# Patient Record
Sex: Female | Born: 1975
Health system: Southern US, Community
[De-identification: ages and names within clinical notes are randomized; demographics above are authoritative.]

## PROBLEM LIST (undated history)

## (undated) DIAGNOSIS — F419 Anxiety disorder, unspecified: Secondary | ICD-10-CM

## (undated) HISTORY — PX: ADENOIDECTOMY: SUR15

## (undated) HISTORY — DX: Anxiety disorder, unspecified: F41.9

## (undated) HISTORY — PX: FRACTURE SURGERY: SHX138

## (undated) HISTORY — PX: OTHER SURGICAL HISTORY: SHX169

---

## 2005-07-07 DIAGNOSIS — R7611 Nonspecific reaction to tuberculin skin test without active tuberculosis: Secondary | ICD-10-CM | POA: Insufficient documentation

## 2005-07-07 LAB — HM PAP SMEAR: HM Pap smear: NORMAL

## 2006-01-29 ENCOUNTER — Ambulatory Visit (HOSPITAL_COMMUNITY): Admission: RE | Admit: 2006-01-29 | Discharge: 2006-01-29 | Payer: Self-pay | Admitting: Infectious Diseases

## 2006-02-08 ENCOUNTER — Ambulatory Visit (HOSPITAL_COMMUNITY): Admission: RE | Admit: 2006-02-08 | Discharge: 2006-02-08 | Payer: Self-pay | Admitting: Internal Medicine

## 2006-02-08 ENCOUNTER — Ambulatory Visit: Payer: Self-pay | Admitting: Internal Medicine

## 2008-02-13 ENCOUNTER — Ambulatory Visit (HOSPITAL_COMMUNITY): Admission: RE | Admit: 2008-02-13 | Discharge: 2008-02-13 | Payer: Self-pay | Admitting: Specialist

## 2008-11-16 ENCOUNTER — Encounter: Payer: Self-pay | Admitting: Internal Medicine

## 2008-11-19 ENCOUNTER — Encounter: Payer: Self-pay | Admitting: Internal Medicine

## 2008-11-19 ENCOUNTER — Ambulatory Visit: Payer: Self-pay | Admitting: Internal Medicine

## 2008-11-19 LAB — CONVERTED CEMR LAB
ALT: 30 units/L (ref 0–35)
AST: 26 units/L (ref 0–37)
Albumin: 4.9 g/dL (ref 3.5–5.2)
Alkaline Phosphatase: 54 units/L (ref 39–117)
BUN: 14 mg/dL (ref 6–23)
CO2: 24 meq/L (ref 19–32)
Calcium: 9.8 mg/dL (ref 8.4–10.5)
Chloride: 105 meq/L (ref 96–112)
Cholesterol: 159 mg/dL (ref 0–200)
Cortisol - AM: 5.4 ug/dL (ref 4.3–22.4)
Creatinine, Ser: 0.61 mg/dL (ref 0.40–1.20)
Ferritin: 10 ng/mL (ref 10–291)
GFR calc Af Amer: >60 mL/min (ref >60)
GFR calc non Af Amer: >60 mL/min (ref >60)
Glucose, Bld: 91 mg/dL (ref 70–99)
HCT: 39.8 % (ref 36.0–46.0)
HDL: 56 mg/dL (ref >39)
Hemoglobin: 13.3 g/dL (ref 12.0–15.0)
LDL Cholesterol: 92 mg/dL (ref 0–99)
MCHC: 33.4 g/dL (ref 30.0–36.0)
MCV: 87.9 fL (ref 78.0–100.0)
Platelets: 242 10*3/uL (ref 150–400)
Potassium: 4.3 meq/L (ref 3.5–5.3)
RBC: 4.53 M/uL (ref 3.87–5.11)
RDW: 14 % (ref 11.5–15.5)
Sodium: 140 meq/L (ref 135–145)
TSH: 1.674 microintl units/mL (ref 0.350–4.500)
Total Bilirubin: 0.5 mg/dL (ref 0.3–1.2)
Total CHOL/HDL Ratio: 2.8
Total Protein: 7.3 g/dL (ref 6.0–8.3)
Triglycerides: 53 mg/dL (ref <150)
VLDL: 11 mg/dL (ref 0–40)
Vit D, 25-Hydroxy: 34 ng/mL (ref 30–89)
Vitamin B-12: 603 pg/mL (ref 211–911)
WBC: 5.4 10*3/uL (ref 4.0–10.5)

## 2008-11-20 ENCOUNTER — Ambulatory Visit: Payer: Self-pay | Admitting: Internal Medicine

## 2010-07-06 HISTORY — PX: WISDOM TOOTH EXTRACTION: SHX21

## 2010-08-05 NOTE — Assessment & Plan Note (Signed)
Summary: acute-overbook/cfb   Vital Signs:  Patient profile:   35 year old female Height:      67 inches (170.18 cm) Weight:      125.06 pounds (56.85 kg) BMI:     19.66 Temp:     97.9 degrees F (36.61 degrees C) oral Pulse rate:   80 / minute BP sitting:   112 / 69  (left arm)  Vitals Entered By: Angelina Ok RN (Nov 20, 2008 1:49 PM) Is Patient Diabetic? No Pain Assessment Patient in pain? no      Nutritional Status BMI of 19 -24 = normal  Have you ever been in a relationship where you felt threatened, hurt or afraid?No   Does patient need assistance? Functional Status Self care Ambulation Normal   History of Present Illness: No complaints. No active issues.  Preventive Screening-Counseling & Management     Alcohol drinks/day: 0     Smoking Status: never     Does Patient Exercise: yes  Allergies: No Known Drug Allergies   Impression & Recommendations:  Problem # 1:  LABORATORY EXAM ORDER PART ROUTINE GEN MED EXAM (ICD-V72.62)  Mild anemia. Otherwise normal labs. No further work-up.   PLan f/u in 1 year. Consider by mouth iron and MVI.  Orders: No Charge Patient Arrived (NCPA0) (NCPA0)

## 2010-08-05 NOTE — Miscellaneous (Signed)
  Clinical Lists Changes  Problems: Added new problem of LABORATORY EXAM ORDER PART ROUTINE GEN MED EXAM (ICD-V72.62) - Signed Orders: Added new Test order of T-Comprehensive Metabolic Panel [id number]) - Signed Added new Test order of T-CBC No Diff ([id number]) - Signed Added new Test order of T-Lipid Profile [phone]) - Signed Added new Test order of T-TSH [phone]) - Signed Added new Test order of T-Vitamin D (25-Hydroxy) [phone]) - Signed Added new Test order of T-Ferritin [phone]) - Signed Added new Test order of T-Vitamin B12 ([id number]) - Signed Added new Test order of T-Cortisol, AM ([id number]) - Signed

## 2013-09-29 LAB — BASIC METABOLIC PANEL
BUN: 19 mg/dL (ref 4–21)
Creatinine: 0.6 mg/dL (ref <=1.1)
Glucose: 75 mg/dL
Potassium: 4.1 mmol/L (ref 3.4–5.3)
Sodium: 140 mmol/L (ref 137–147)

## 2013-09-29 LAB — TSH: TSH: 1.26 u[IU]/mL (ref <=5.90)

## 2013-09-29 LAB — HEPATIC FUNCTION PANEL
ALT: 10 U/L (ref 7–35)
AST: 18 U/L (ref 13–35)
Alkaline Phosphatase: 45 U/L (ref 25–125)

## 2013-09-29 LAB — CBC AND DIFFERENTIAL
HCT: 37 % (ref 36–46)
Hemoglobin: 12.6 g/dL (ref 12.0–16.0)
Platelets: 205 10*3/uL (ref 150–399)
WBC: 5.6 10^3/mL

## 2013-09-29 LAB — LIPID PANEL
Cholesterol: 114 mg/dL (ref 0–200)
HDL: 51 mg/dL (ref 35–70)
LDL Cholesterol: 53 mg/dL
Triglycerides: 50 mg/dL (ref 40–160)

## 2013-09-29 LAB — HEMOGLOBIN A1C: Hgb A1c MFr Bld: 5.4 % (ref 4.0–6.0)

## 2014-02-14 ENCOUNTER — Other Ambulatory Visit (INDEPENDENT_AMBULATORY_CARE_PROVIDER_SITE_OTHER): Payer: Self-pay

## 2014-02-14 ENCOUNTER — Other Ambulatory Visit: Payer: Self-pay | Admitting: Endocrinology

## 2014-02-14 DIAGNOSIS — R300 Dysuria: Secondary | ICD-10-CM

## 2014-02-14 LAB — URINALYSIS, ROUTINE W REFLEX MICROSCOPIC
Bilirubin Urine: NEGATIVE
Hgb urine dipstick: NEGATIVE
Ketones, ur: NEGATIVE
Leukocytes, UA: NEGATIVE
Nitrite: NEGATIVE
RBC / HPF: NONE SEEN (ref 0–?)
Specific Gravity, Urine: 1.02 (ref 1.000–1.030)
Total Protein, Urine: NEGATIVE
Urine Glucose: NEGATIVE
Urobilinogen, UA: 0.2 (ref 0.0–1.0)
pH: 8 (ref 5.0–8.0)

## 2014-09-12 ENCOUNTER — Encounter: Payer: Self-pay | Admitting: Family Medicine

## 2014-09-12 ENCOUNTER — Ambulatory Visit (INDEPENDENT_AMBULATORY_CARE_PROVIDER_SITE_OTHER): Payer: 59 | Admitting: Family Medicine

## 2014-09-12 DIAGNOSIS — R002 Palpitations: Secondary | ICD-10-CM | POA: Insufficient documentation

## 2014-09-12 DIAGNOSIS — Z0000 Encounter for general adult medical examination without abnormal findings: Secondary | ICD-10-CM | POA: Insufficient documentation

## 2014-09-12 DIAGNOSIS — M545 Low back pain, unspecified: Secondary | ICD-10-CM

## 2014-09-12 DIAGNOSIS — Z789 Other specified health status: Secondary | ICD-10-CM | POA: Insufficient documentation

## 2014-09-12 DIAGNOSIS — M542 Cervicalgia: Secondary | ICD-10-CM

## 2014-09-12 DIAGNOSIS — M533 Sacrococcygeal disorders, not elsewhere classified: Secondary | ICD-10-CM

## 2014-09-12 LAB — FERRITIN: Ferritin: 36

## 2014-09-12 LAB — VITAMIN D 25 HYDROXY (VIT D DEFICIENCY, FRACTURES): Vit D, 25-Hydroxy: 44

## 2014-09-12 LAB — T3, FREE: T3, Free Index: 2.7

## 2014-09-12 LAB — T4, FREE: Free T4: 1.19

## 2014-09-12 LAB — VITAMIN B12: Vitamin B12 Bind Capacity: 654

## 2014-09-12 MED ORDER — MAGNESIUM GLUCONATE 500 MG PO TABS: 500.0000 mg | ORAL_TABLET | Freq: Every day | ORAL | Status: AC

## 2014-09-12 MED ORDER — DICLOFENAC EPOLAMINE 1.3 % TD PTCH: 1.0000 | MEDICATED_PATCH | Freq: Two times a day (BID) | TRANSDERMAL | Status: DC

## 2014-09-12 NOTE — Assessment & Plan Note (Signed)
>  5 years. We will obtain films of coccyx given chronicity hopeful for PA and lateral. Also obtain lumbar films though suspect lower yield. She will continue conservative measures of watching her position. IN addition, will trial diclofenac patch. If not helpful, could consider lidocaine.   Could also consider sports medicine or otho referral. i doubt manipulation would happen as seems to have direct pain and not muscle spasms as cause of pain. Option for coccygeal injections would potentially ask Dr. Katrinka BlazingSmith of sports medicine who he would recommend.

## 2014-09-12 NOTE — Progress Notes (Signed)
Elizabeth ConchStephen Kaelene Elliston, MD Phone: [phone]  Subjective:  Patient presents today to establish care. Chief complaint-noted.   Coccydynia -history of back pain with pregnancy. Osteitis Pubis. Had hip pain and later symphysis pain. Nearly fainted in pregnancy related to those pains. Since that time, she developed pain with sitting. Cannot sit directly on coccyx. Severe pain with sitting straight up.  Pain up to 7/10. Sensitivity with rubbing the area. Avoids eating in restaurants depending on chair. Ok with sitting forward such as work position. Never had films. Worse with prolonged sitting such as plane flights or on certain hard chairs. Interested in topical ROS- no fecal or urinary incontinence, no leg weakness   Palpitations History of L upper chest pain and current neck pain  Has [place] chiropractor in [place] after hitting Left upper chest against an object. Since that time in 2012 developed palpitations. Has had upper back pain after initial chest injury behind Left upper scapula. Has had laser acupuncture which improved. Her palpitations have been present since that time. If she has a busier or more stressful period, her palpitations are certainly worse. When her husband is off on weekends and can help at home for example she does not note the palpitations. Most aware when she is resting. Has checked her pulse and can tell there is an occasional skipped and then subsequent strong beat.   Does have some cervical neck pain with sitting and leaning forward. Better with exercising. Has seen chiropractor and considering going back to the one she has seen in [place] in the past  ROS- no chest pain/shortness of breath/diaphoresis, left arm or neck pain (pain in neck is midline on vertebrae at base of neck likely around c7. )  The following were reviewed and entered/updated in epic: No past medical history on file. Patient Active Problem List   Diagnosis Date Noted  . Coccydynia 09/12/2014   Priority: Medium  . Vegetarian 09/12/2014    Priority: Low  . Health care maintenance 09/12/2014    Priority: Low  . 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 Palpitations 09/12/2014   Past Surgical History  Procedure Laterality Date  . Wisdom tooth extraction  2012  . Adenoidectomy      as child  . Other surgical history      fractured hunmerus 1983.    Family History  Problem Relation Age of Onset  . Arthritis Mother     knee  . Hyperlipidemia Father   . Hypertension Father   . CAD      50, likely-has refused cath    Medications- reviewed and updated Current Outpatient Prescriptions  Medication Sig Dispense Refill  . magnesium gluconate (MAGONATE) 500 MG tablet Take 500 mg by mouth 2 (two) times daily.    . Multiple Vitamin (MULTIVITAMIN) tablet Take 1 tablet by mouth daily.    . diclofenac (FLECTOR) 1.3 % PTCH Place 1 patch onto the skin 2 (two) times daily. 5 patch 5   No current facility-administered medications for this visit.    Allergies-reviewed and updated No Known Allergies  History   Social History  . Marital Status: Married    Spouse Name: N/A  . Number of Children: N/A  . Years of Education: N/A   Social History Main Topics  . Smoking status: Never Smoker   . Smokeless tobacco: Not on file  . Alcohol Use: 0.6 oz/week    1 Standard drinks or equivalent per week  . Drug Use: No  . Sexual  Activity: Not on file   Other Topics Concern  . Not on file   Social History Narrative   Married. Osborne Casco '06. Husband is a hospitalist (may establish at [place])   Lives with husband and daughter.       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     ROS--See HPI , otherwise full ROS was completed and negative except as noted above  Objective: BP 110/72 mmHg  Pulse 83  Temp(Src) 98.5 F (36.9 C)  Ht  (1.702 m)  Wt 120 lb (54.432  kg)  BMI 18.79 kg/m2 Gen: NAD, resting comfortably, thin HEENT: Mucous membranes are moist. Oropharynx normal. TM normal. Eyes: sclera and lids normal, PERRLA Neck:  no cervical lymphadenopathy CV: RRR no murmurs rubs or gallops Lungs: CTAB no crackles, wheeze, rhonchi Abdomen: soft/nontender/nondistended/normal bowel sounds. No rebound or guarding.  Ext: no edema MSK: pain with direct palpation of coccyx but unable to reproduce in other position. Pain with direct palpation around c7 vertebrae Skin: warm, dry, no rash Neuro: 5/5 strength in upper and lower extremities, normal gait, normal reflexes  EKG: rate 68 NSR, slight flattening v2  Assessment/Plan:  Coccydynia >5 years. We will obtain films of coccyx given chronicity hopeful for PA and lateral. Also obtain lumbar films though suspect lower yield. She will continue conservative measures of watching her position. IN addition, will trial diclofenac patch. If not helpful, could consider lidocaine.   Could also consider sports medicine or otho referral. i doubt manipulation would happen as seems to have direct pain and not muscle spasms as cause of pain. Option for coccygeal injections would potentially ask Dr. Katrinka Blazing of sports medicine who he would recommend.    Palpitations EKG today revealed NSR. We discussed holter monitor for at least 24 hours on busier week as well as potentially echocardiogram. Patient not interested in pursuing at this time. Also discussed CBT and not interested. 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 Husband is a hospitalist and they will discuss.    we will also obtain neck films with pain of palpation around c7. Mild associated spasm lateral to this.   1 year or prn   Orders Placed This Encounter  Procedures  . DG Sacrum/Coccyx    Standing Status: Future     Number of  Occurrences:      Standing Expiration Date: 11/12/2015    Order Specific Question:  Reason for Exam (SYMPTOM  OR DIAGNOSIS REQUIRED)    Answer:  2 view, coccygeal pain    Order Specific Question:  Is the patient pregnant?    Answer:  No    Order Specific Question:  Preferred imaging location?    Answer:  [facility]  . DG Lumbar Spine Complete    Standing Status: Future     Number of Occurrences:      Standing Expiration Date: 11/12/2015    Order Specific Question:  Reason for Exam (SYMPTOM  OR DIAGNOSIS REQUIRED)    Answer:  low back pain, coccygeal pain    Order Specific Question:  Is the patient pregnant?    Answer:  No    Order Specific Question:  Preferred imaging location?    Answer:  [facility]  . DG Cervical Spine Complete    Standing  Status: Future     Number of Occurrences:      Standing Expiration Date: 11/12/2015    Order Specific Question:  Reason for Exam (SYMPTOM  OR DIAGNOSIS REQUIRED)    Answer:  pain with palpation around c7, upper back pain    Order Specific Question:  Is the patient pregnant?    Answer:  No    Order Specific Question:  Preferred imaging location?    Answer:  [facility]  . EKG 12-Lead    Meds ordered this encounter  . diclofenac (FLECTOR) 1.3 % PTCH    Sig: Place 1 patch onto the skin 2 (two) times daily.    Dispense:  5 patch    Refill:  5

## 2014-09-12 NOTE — Patient Instructions (Signed)
Great to meet you in person!   Let's get images of lumbar spine as well as sacrum/coccyx (I requested they do 2 views) as well as cervical spine.   EKG largely unremarkable. I cannot say that this rules out cardiac disease but it is somewhat reassuring.   Let's see each other at least every 2 years or as needed. But happy to see you sooner.

## 2014-09-12 NOTE — Assessment & Plan Note (Signed)
EKG today revealed NSR. We discussed holter monitor for at least 24 hours on busier week as well as potentially echocardiogram. Patient not interested in pursuing at this time. Also discussed CBT and not interested. 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 Husband is a hospitalist and they will discuss.

## 2014-09-18 ENCOUNTER — Other Ambulatory Visit: Payer: Self-pay | Admitting: Family Medicine

## 2014-09-18 ENCOUNTER — Ambulatory Visit (INDEPENDENT_AMBULATORY_CARE_PROVIDER_SITE_OTHER)
Admission: RE | Admit: 2014-09-18 | Discharge: 2014-09-18 | Disposition: A | Discharge status: Home / Self Care | Payer: 59 | Source: Ambulatory Visit | Attending: Family Medicine | Admitting: Family Medicine

## 2014-09-18 DIAGNOSIS — M545 Low back pain, unspecified: Secondary | ICD-10-CM

## 2014-09-18 DIAGNOSIS — M542 Cervicalgia: Secondary | ICD-10-CM

## 2014-09-20 ENCOUNTER — Encounter: Payer: Self-pay | Admitting: Family Medicine

## 2014-09-26 ENCOUNTER — Encounter: Payer: Self-pay | Admitting: Occupational Medicine

## 2014-10-19 ENCOUNTER — Other Ambulatory Visit: Payer: 59

## 2014-10-19 DIAGNOSIS — Z0000 Encounter for general adult medical examination without abnormal findings: Secondary | ICD-10-CM

## 2014-10-19 LAB — CBC AND DIFFERENTIAL
HCT: 39 % (ref 36–46)
Hemoglobin: 12.8 g/dL (ref 12.0–16.0)
Platelets: 257 10*3/uL (ref 150–399)
WBC: 5.4 10^3/mL

## 2014-10-19 LAB — BASIC METABOLIC PANEL
BUN: 21 mg/dL (ref 4–21)
Creatinine: 0.7 mg/dL (ref <=1.1)
Glucose: 92 mg/dL
Potassium: 4.3 mmol/L (ref 3.4–5.3)
Sodium: 140 mmol/L (ref 137–147)

## 2014-10-19 LAB — LIPID PANEL
Cholesterol: 127 mg/dL (ref 0–200)
LDL Cholesterol: 60 mg/dL
Triglycerides: 47 mg/dL (ref 40–160)

## 2014-10-19 LAB — TSH: TSH: 2.04 u[IU]/mL (ref <=5.90)

## 2014-10-19 LAB — HEMOGLOBIN A1C: Hgb A1c MFr Bld: 5.3 % (ref 4.0–6.0)

## 2014-10-19 LAB — HEPATIC FUNCTION PANEL
ALT: 28 U/L (ref 7–35)
AST: 24 U/L (ref 13–35)
Bilirubin, Total: 0.6 mg/dL

## 2014-10-29 ENCOUNTER — Encounter: Payer: Self-pay | Admitting: Family Medicine

## 2014-10-29 LAB — FERRITIN
Ferritin: 24
Vit D, 25-Hydroxy: 47
Vitamin B12 Bind Capacity: 740

## 2014-11-09 ENCOUNTER — Encounter: Payer: Self-pay | Admitting: Family Medicine

## 2014-12-09 ENCOUNTER — Encounter: Payer: Self-pay | Admitting: Family Medicine

## 2014-12-10 ENCOUNTER — Other Ambulatory Visit: Payer: Self-pay

## 2014-12-10 MED ORDER — LIDOCAINE 5 % EX PTCH: 1.0000 | MEDICATED_PATCH | CUTANEOUS | Status: DC

## 2014-12-10 NOTE — Telephone Encounter (Signed)
rx sent in 

## 2015-10-18 LAB — HEPATIC FUNCTION PANEL
ALT: 12 U/L (ref 7–35)
AST: 15 U/L (ref 13–35)
Alkaline Phosphatase: 48 U/L (ref 25–125)
Bilirubin, Total: 0.7 mg/dL

## 2015-10-18 LAB — CBC AND DIFFERENTIAL
HCT: 38 % (ref 36–46)
Hemoglobin: 12.3 g/dL (ref 12.0–16.0)
Platelets: 213 10*3/uL (ref 150–399)
WBC: 7.4 10^3/mL

## 2015-10-18 LAB — BASIC METABOLIC PANEL
BUN: 23 mg/dL — AB (ref 4–21)
Creatinine: 0.6 mg/dL (ref 0.5–1.1)
Glucose: 86 mg/dL
Potassium: 4.4 mmol/L (ref 3.4–5.3)
Sodium: 138 mmol/L (ref 137–147)

## 2015-10-18 LAB — HEMOGLOBIN A1C: Hemoglobin A1C: 5.3

## 2015-10-18 LAB — LIPID PANEL
Cholesterol: 134 mg/dL (ref 0–200)
HDL: 62 mg/dL (ref 35–70)
LDL Cholesterol: 63 mg/dL
Triglycerides: 44 mg/dL (ref 40–160)

## 2015-10-18 LAB — TSH: TSH: 1.68 u[IU]/mL (ref 0.41–5.90)

## 2015-11-07 ENCOUNTER — Other Ambulatory Visit: Payer: Self-pay

## 2015-11-08 ENCOUNTER — Encounter: Payer: Self-pay | Admitting: Family Medicine

## 2015-11-12 ENCOUNTER — Encounter: Payer: Self-pay | Admitting: Family Medicine

## 2016-09-24 LAB — BASIC METABOLIC PANEL
BUN: 19 mg/dL (ref 4–21)
Creatinine: 0.6 mg/dL (ref 0.5–1.1)
Glucose: 90 mg/dL
Potassium: 5.3 mmol/L (ref 3.4–5.3)
Sodium: 140 mmol/L (ref 137–147)

## 2016-09-24 LAB — CBC AND DIFFERENTIAL
Hemoglobin: 13 g/dL (ref 12.0–16.0)
Platelets: 250 10*3/uL (ref 150–399)
WBC: 4.4 10^3/mL

## 2016-09-24 LAB — LIPID PANEL
Cholesterol: 135 mg/dL (ref 0–200)
HDL: 61 mg/dL (ref 35–70)
LDL Cholesterol: 63 mg/dL
LDl/HDL Ratio: 2.2
Triglycerides: 57 mg/dL (ref 40–160)

## 2016-09-24 LAB — TSH: TSH: 1.39 u[IU]/mL (ref 0.41–5.90)

## 2016-09-24 LAB — HEPATIC FUNCTION PANEL: AST: 19 U/L (ref 13–35)

## 2016-10-09 ENCOUNTER — Encounter: Payer: Self-pay | Admitting: Family Medicine

## 2016-10-09 ENCOUNTER — Ambulatory Visit (INDEPENDENT_AMBULATORY_CARE_PROVIDER_SITE_OTHER): Payer: 59 | Admitting: Family Medicine

## 2016-10-09 VITALS — BP 98/52 | HR 74 | Temp 97.9°F | Ht 67.0 in | Wt 117.8 lb

## 2016-10-09 DIAGNOSIS — M25511 Pain in right shoulder: Secondary | ICD-10-CM | POA: Diagnosis not present

## 2016-10-09 DIAGNOSIS — M869 Osteomyelitis, unspecified: Secondary | ICD-10-CM | POA: Diagnosis not present

## 2016-10-09 DIAGNOSIS — G8929 Other chronic pain: Secondary | ICD-10-CM | POA: Diagnosis not present

## 2016-10-09 NOTE — Assessment & Plan Note (Addendum)
S:Osteitis pubis history bothering her more but still running 30 minutes daily A/P: Chronic issue. Recently flared up with some pain initially into left hip now centralized. Still running daily. Encouraged 2-3 weeks off, icing, before restarting running and start slow and gradually increase

## 2016-10-09 NOTE — Progress Notes (Signed)
Subjective:  Elizabeth Carter is a 41 y.o. year old very pleasant female patient who presents for/with See problem oriented charting ROS- no fever, chills, redness over shoulder. No chest pain or shortness of breath   Past Medical History-  Patient Active Problem List   Diagnosis Date Noted  . Osteitis pubis (HCC) 10/09/2016    Priority: Low  . Vegetarian 09/12/2014    Priority: Low  . Coccydynia 09/12/2014    Priority: Low  . Health care maintenance 09/12/2014    Priority: Low  . 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 Palpitations 09/12/2014    Medications- reviewed and updated Current Outpatient Prescriptions  Medication Sig Dispense Refill  . magnesium gluconate (MAGONATE) 500 MG tablet Take 1 tablet (500 mg total) by mouth daily.    . Multiple Vitamin (MULTIVITAMIN) tablet Take 1 tablet by mouth daily.     Objective: BP (!) 98/52 (BP Location: Left Arm, Patient Position: Sitting, Cuff Size: Normal)   Pulse 74   Temp 97.9 F (36.6 C) (Oral)   Ht  (1.702 m)   Wt 117 lb 12.8 oz (53.4 kg)   LMP 09/14/2016 (Exact Date)   SpO2 99%   BMI 18.45 kg/m  Gen: NAD, resting comfortably, thin, well appearing CV: RRR no murmurs rubs or gallops Lungs: CTAB no crackles, wheeze, rhonchi   Shoulder: Inspection reveals no abnormalities, atrophy or asymmetry. Palpation is normal with no tenderness over AC joint or bicipital groove. ROM is full in all planes but limited by pain.  Rotator cuff strength normal throughout. Signs of impingement with positive Neer (mild) and Hawkin's tests (most painful), empty can. Speeds and Yergason's tests normal. No painful arc and no drop arm sign. She has pain with resisted external rotation  Assessment/Plan:  Chronic right shoulder pain - Plan: MR Shoulder Right Wo Contrast S: Right shoulder pain for at least 7 years. Started after she went to the gym years ago and without stretching was doing some heavy rowing. If arm still in  one position and then move it then it can be very severe. To put on seat belt has to support with other arm. External rotation hurts it. Pain primarily anterior. Can be severe at times up to 9/10. Daily mild pain. Slowly getting worse.   Will be returning to [place] and wants her doctor there to be able to evaluate using imaging done here.  A/P: No fall or injury. Very unlikely to be OA at her age and symptoms do not present as such. MRI likely more beneficial than x-ray so will start there. We discussed ultrasound and consideration of steroid injection- she would prefer less invasive approach which she can achieve in [place] but needs MRI report and disc to be able to achieve.   Osteitis pubis (HCC) Chronic issue. Recently flared up with some pain initially into left hip now centralized. Still running daily. Encouraged 2-3 weeks off, icing, before restarting running and start slow and gradually increase  Orders Placed This Encounter  Procedures  . MR Shoulder Right Wo Contrast    Standing Status:   Future    Standing Expiration Date:   12/09/2017    Order Specific Question:   Reason for Exam (SYMPTOM  OR DIAGNOSIS REQUIRED)    Answer:   chronic right shoulder pain for 7 years. low chance of arthritis    Order Specific Question:   What is the patient's sedation requirement?    Answer:   No Sedation  Order Specific Question:   Does the patient have a pacemaker or implanted devices?    Answer:   No    Order Specific Question:   Preferred imaging location?    Answer:   [facility] (table limit-550lbs)    Order Specific Question:   Radiology Contrast Protocol - do NOT remove file path    Answer:   [email or web address]   Return precautions advised.  Tana Conch, MD

## 2016-10-09 NOTE — Progress Notes (Signed)
Pre visit review using our clinic review tool, if applicable. No additional management support is needed unless otherwise documented below in the visit note. 

## 2016-10-09 NOTE — Patient Instructions (Signed)
We will call you within a week or two about your referral for MRI. If you do not hear within 3 weeks, give [place] a call.   Encourage resting a little longer for the hip and osteitis pubis. Probably 2-3 weeks off

## 2016-10-22 ENCOUNTER — Other Ambulatory Visit: Payer: 59

## 2016-10-23 ENCOUNTER — Ambulatory Visit
Admission: RE | Admit: 2016-10-23 | Discharge: 2016-10-23 | Disposition: A | Discharge status: Home / Self Care | Payer: 59 | Source: Ambulatory Visit | Attending: Family Medicine | Admitting: Family Medicine

## 2016-10-23 DIAGNOSIS — M25511 Pain in right shoulder: Principal | ICD-10-CM

## 2016-10-23 DIAGNOSIS — G8929 Other chronic pain: Secondary | ICD-10-CM

## 2016-10-24 ENCOUNTER — Encounter: Payer: Self-pay | Admitting: Family Medicine

## 2017-03-04 ENCOUNTER — Encounter: Payer: Self-pay | Admitting: Family Medicine

## 2017-03-05 ENCOUNTER — Other Ambulatory Visit: Payer: Self-pay

## 2017-03-05 DIAGNOSIS — M25519 Pain in unspecified shoulder: Secondary | ICD-10-CM

## 2017-03-07 IMAGING — CR DG CERVICAL SPINE 2 OR 3 VIEWS
3 series · 3 of 3 positions shown · non-contrast
Comparison: None.

CLINICAL DATA: Neck pain.  No known injury.

EXAM:
CERVICAL SPINE - 2-3 VIEW

[view not recorded (1 of 3)]
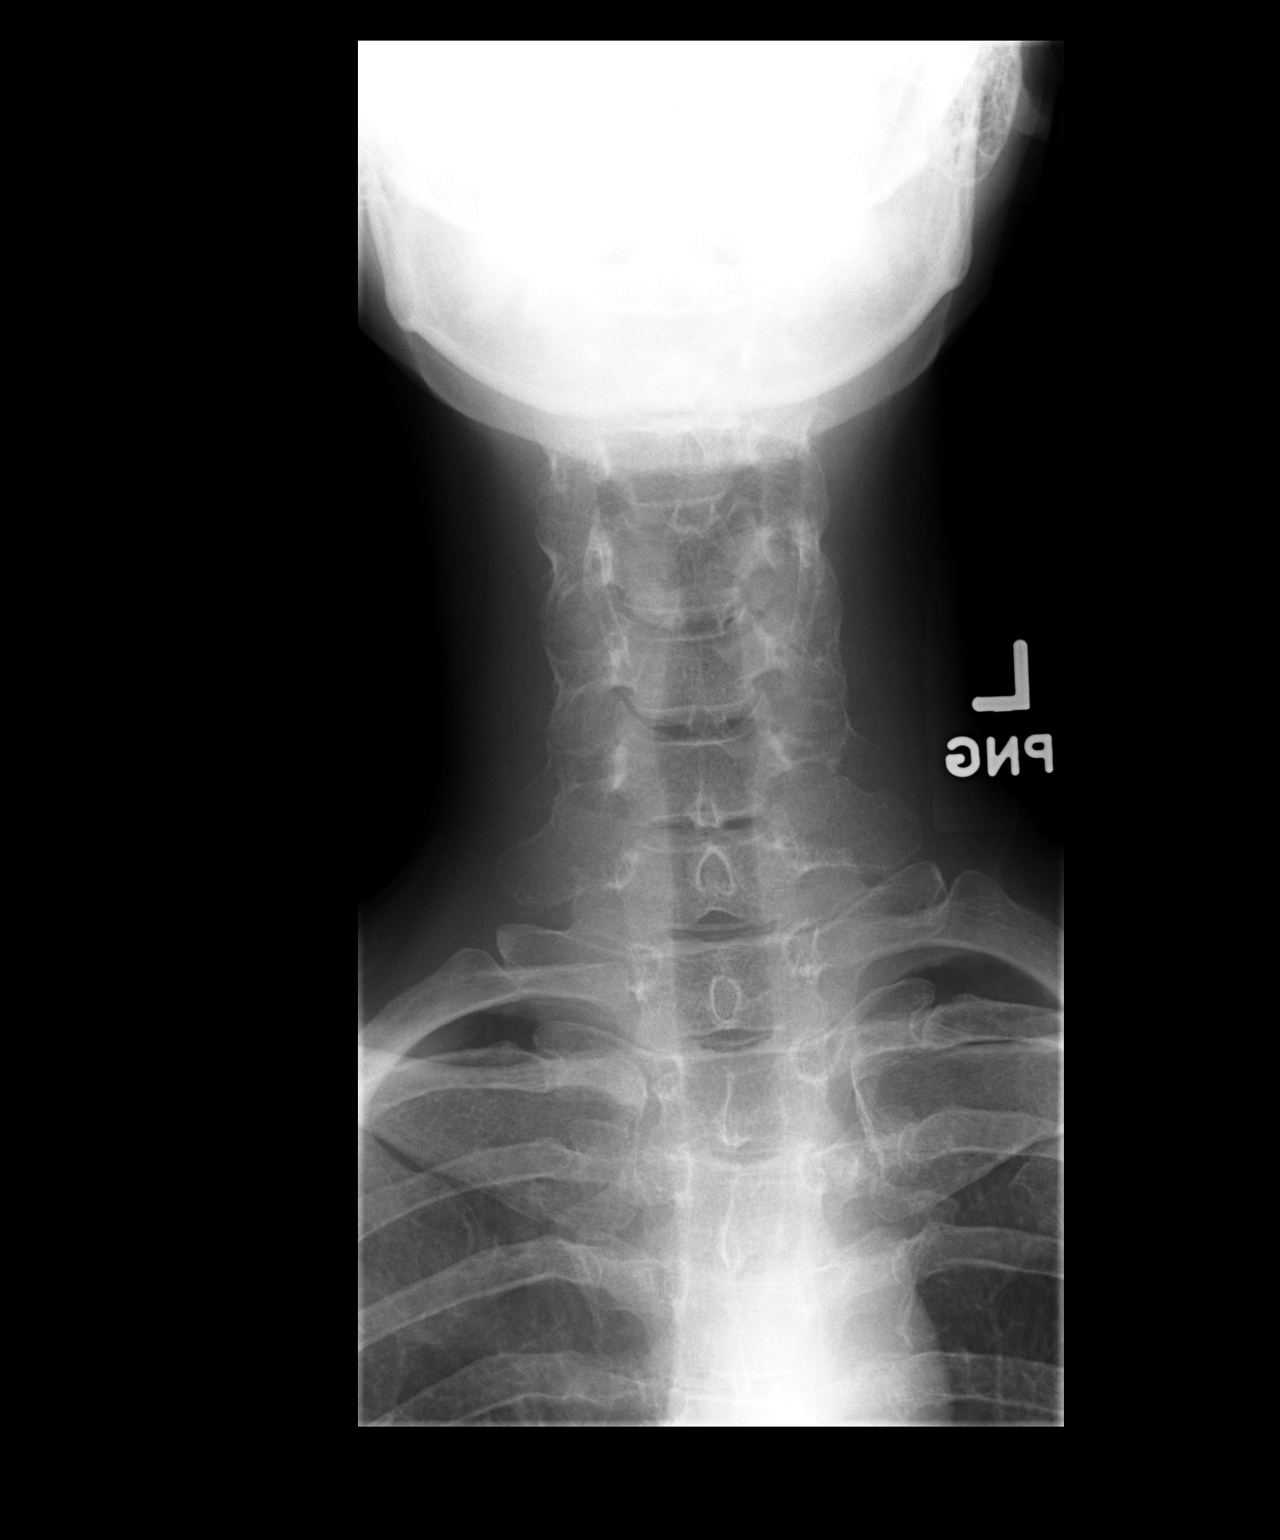

[view not recorded (2 of 3)]
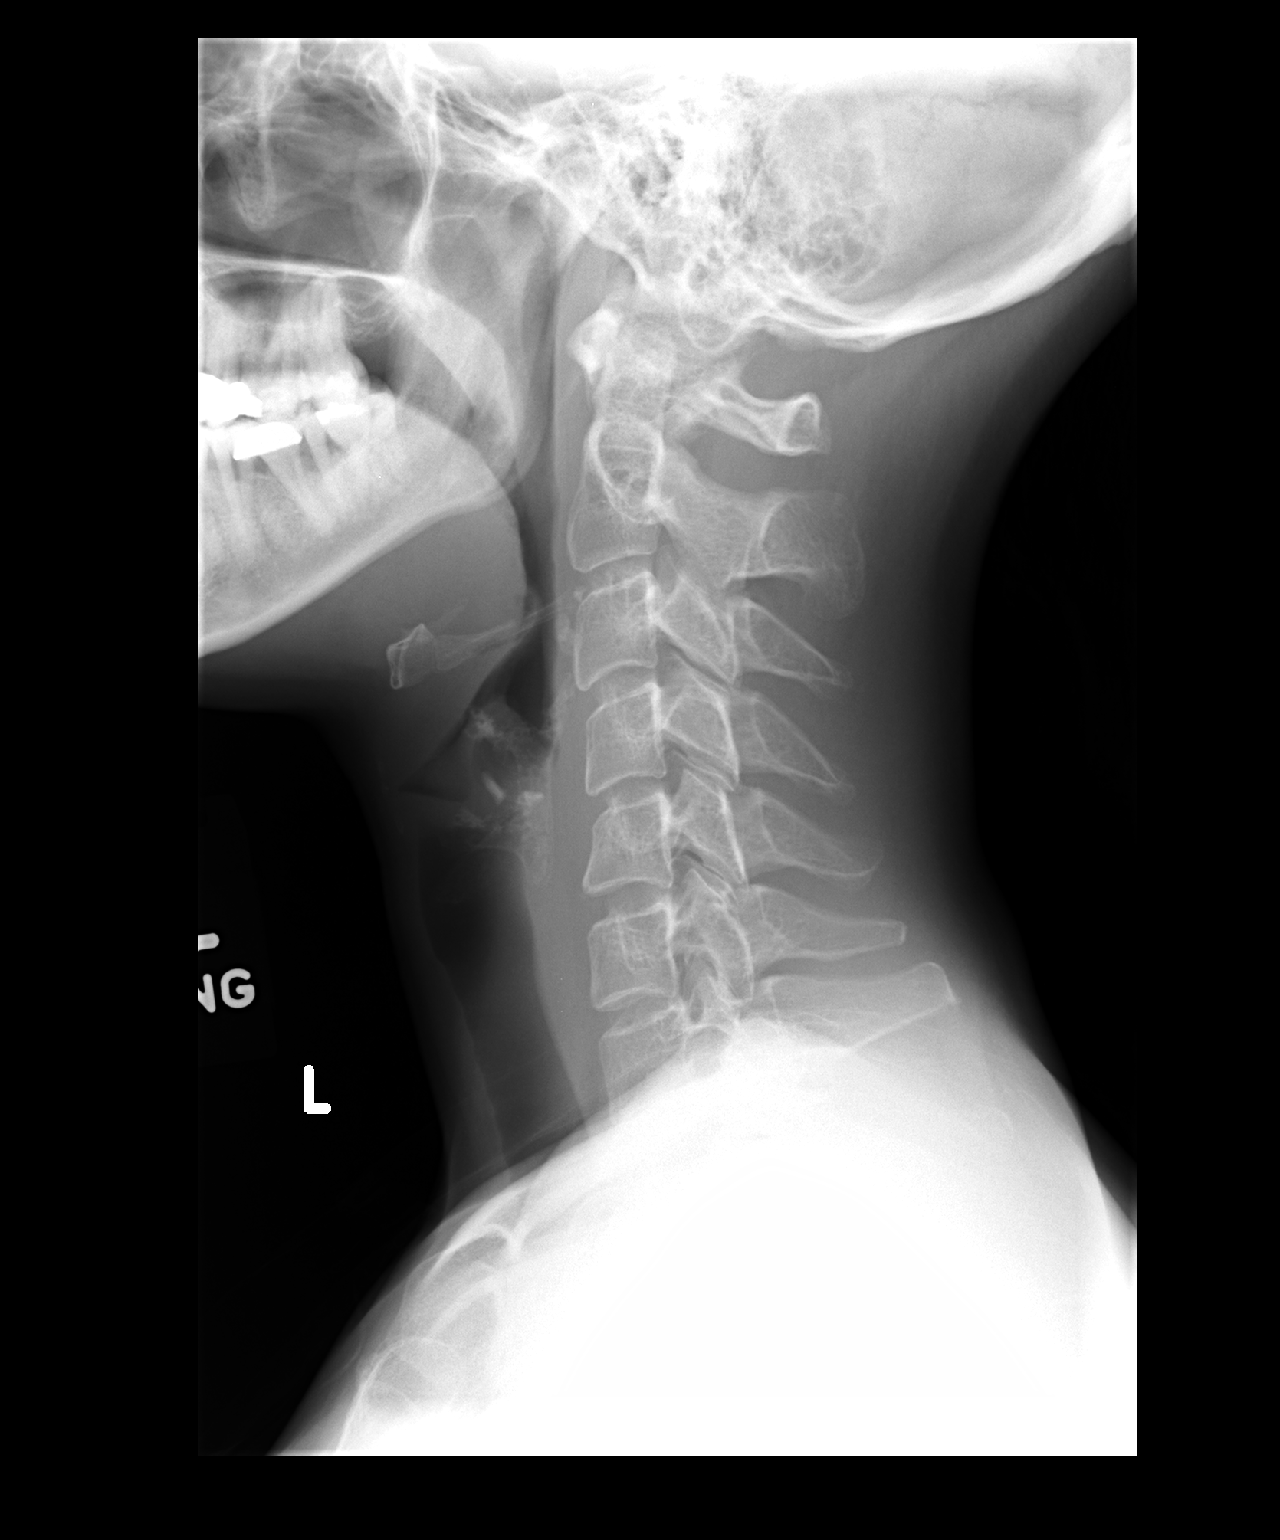

[view not recorded (3 of 3)]
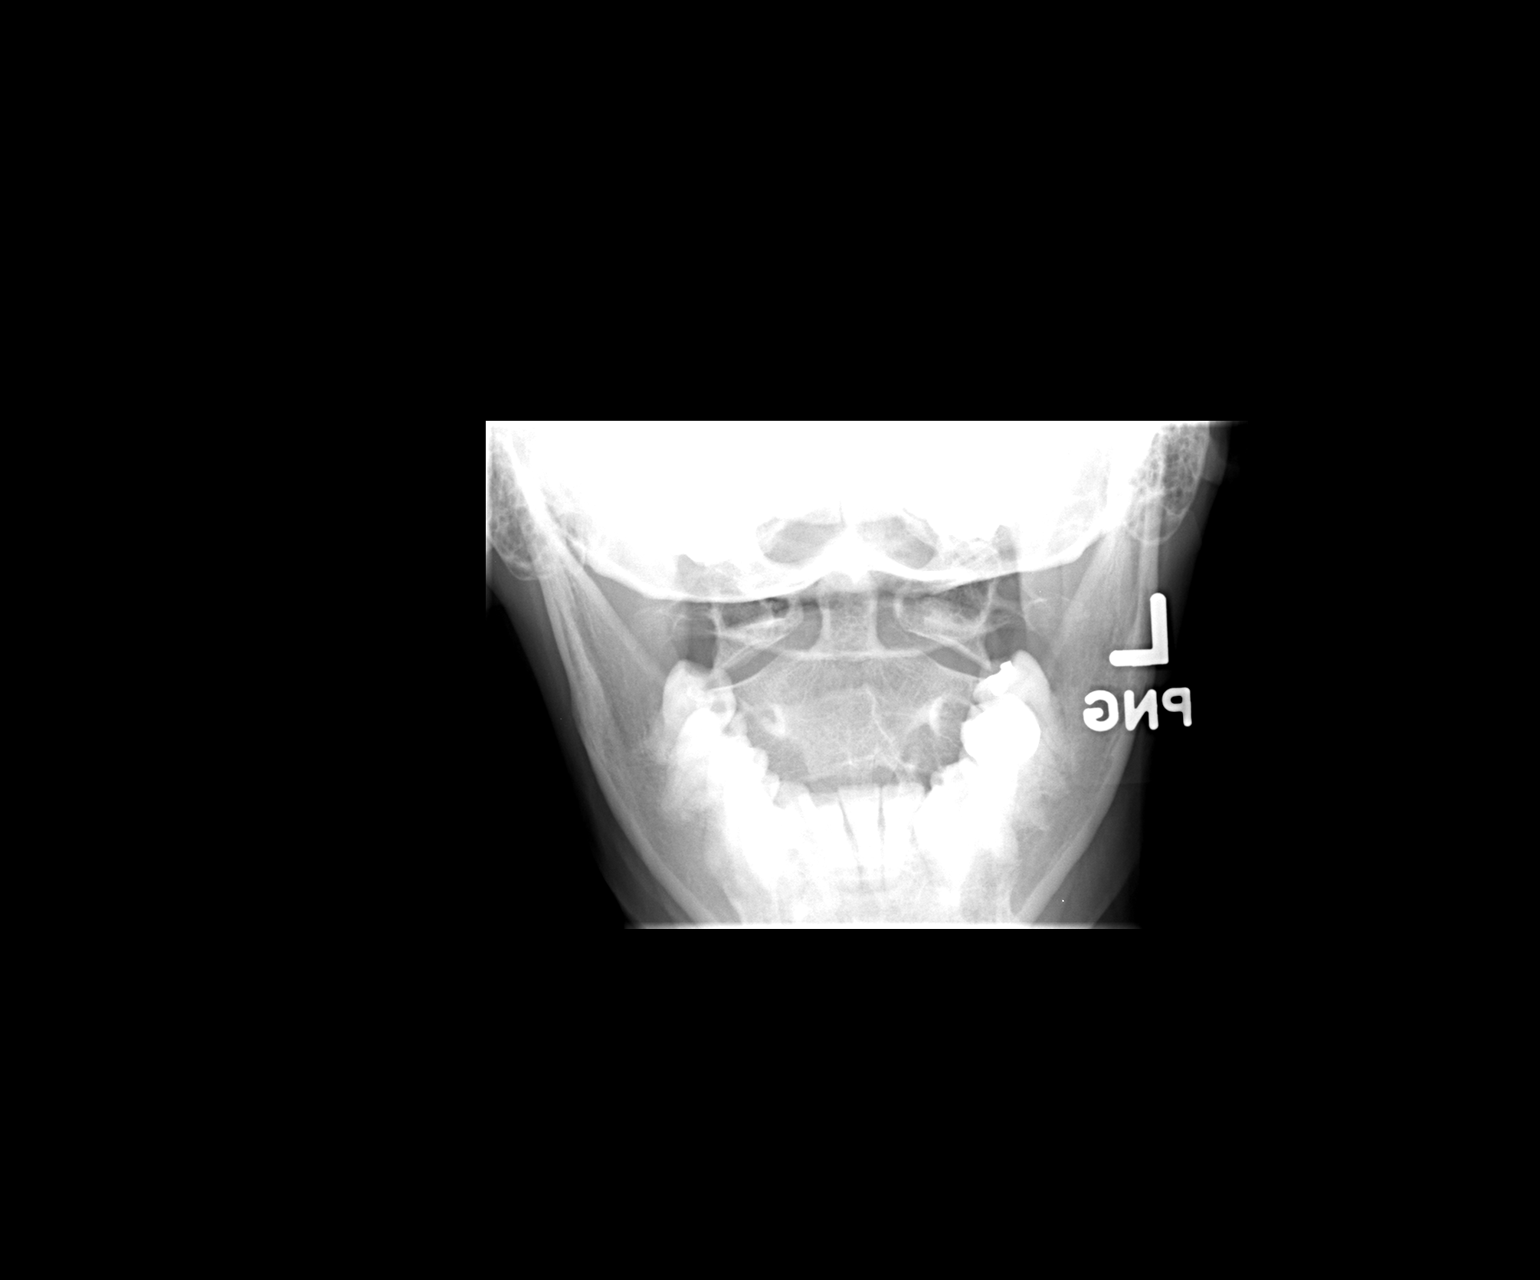

[3 of 3 positions shown; findings below may reference images not displayed]

FINDINGS: The cervical spine is visualized to the level of C7.

The vertebral body heights are maintained. The alignment is normal.
The prevertebral soft tissues are normal. There is no acute fracture
or static listhesis. The disc spaces are maintained.

## 2017-03-24 DIAGNOSIS — M7531 Calcific tendinitis of right shoulder: Secondary | ICD-10-CM | POA: Diagnosis not present

## 2017-08-03 ENCOUNTER — Encounter: Payer: Self-pay | Admitting: Family Medicine

## 2017-08-03 DIAGNOSIS — M25519 Pain in unspecified shoulder: Secondary | ICD-10-CM

## 2017-08-24 NOTE — Progress Notes (Signed)
[facility] [address] [place], [place] [address] Phone: [phone] Subjective:    I'm seeing this patient by the request  of:  Shelva Majestic, MD   CC: Right shoulder pain  [id number]  Elizabeth Carter is a 42 y.o. female coming in with complaint of right shoulder pain. Chronic. Anterior pain. Initially everything made her pain worse. Adduction, ADL, overhead reaching etc. Has neck pain. States her neck feels tight. States she can't really exercise her upper body. Consistent pain that gets worse at night when turning over and sleeping on it.  Rates the severity of pain is 7 out of 10.    Patient did have an MRI of the shoulder done October 23, 2016.  Independently visualized by me showing calcific tendinitis of the supraspinatus without any true tear.  Patient had seen another provider and was to be physical therapy but is finding it difficult secondary to time   History reviewed. No pertinent past medical history. Past Surgical History:  Procedure Laterality Date  . ADENOIDECTOMY     as child  . OTHER SURGICAL HISTORY     fractured hunmerus 1983.  . WISDOM TOOTH EXTRACTION  2012   Social History   Socioeconomic History  . Marital status: Married    Spouse name: None  . Number of children: None  . Years of education: None  . Highest education level: None  Social Needs  . Financial resource strain: None  . Food insecurity - worry: None  . Food insecurity - inability: None  . Transportation needs - medical: None  . Transportation needs - non-medical: None  Occupational History  . None  Tobacco Use  . Smoking status: Never Smoker  . Smokeless tobacco: Never Used  Substance and Sexual Activity  . Alcohol use: Yes    Alcohol/week: 0.6 oz    Types: 1 Standard drinks or equivalent per week  . Drug use: No  . Sexual activity: None  Other Topics Concern  . None  Social History Narrative   Married. Osborne Casco '06. Husband is a hospitalist  (may establish at [place])   Lives with husband and daughter.       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    No Known Allergies Family History  Problem Relation Age of Onset  . Arthritis Mother        knee  . Hyperlipidemia Father   . Hypertension Father   . CAD Unknown        50, likely-has refused cath     Past medical history, social, surgical and family history all reviewed in electronic medical record.  No pertanent information unless stated regarding to the chief complaint.   Review of Systems:Review of systems updated and as accurate as of 08/25/17  No headache, visual changes, nausea, vomiting, diarrhea, constipation, dizziness, abdominal pain, skin rash, fevers, chills, night sweats, weight loss, swollen lymph nodes, body aches, joint swelling, muscle aches, chest pain, shortness of breath, mood changes.    Objective  Blood pressure 102/70, pulse 67, height 5\' 8"  (1.727 m), weight 119 lb (54 kg), SpO2 99 %. Systems examined below as of 08/25/17   General: No apparent distress alert and oriented x3 mood and affect normal, dressed appropriately.  HEENT: Pupils equal, extraocular movements intact  Respiratory: Patient's speak in full sentences and does not appear short of breath  Cardiovascular: No lower extremity  edema, non tender, no erythema  Skin: Warm dry intact with no signs of infection or rash on extremities or on axial skeleton.  Abdomen: Soft nontender  Neuro: Cranial nerves II through XII are intact, neurovascularly intact in all extremities with 2+ DTRs and 2+ pulses.  Lymph: No lymphadenopathy of posterior or anterior cervical chain or axillae bilaterally.  Gait normal with good balance and coordination.  MSK:  Non tender with full range of motion and good stability and symmetric strength and tone of  elbows, wrist, hip, knee and ankles bilaterally.    Shoulder: Right Inspection reveals no abnormalities, atrophy or asymmetry. Palpation is normal with no tenderness over AC joint or bicipital groove. ROM is full in all planes passively. Rotator cuff strength normal throughout. signs of impingement with positive Neer and Hawkin's tests, but negative empty can sign. Speeds and Yergason's tests normal. No labral pathology noted with negative Obrien's, negative clunk and good stability. Normal scapular function observed. No painful arc and no drop arm sign. No apprehension sign  MSK US performed of: Right This study was ordered, performed, and interpreted by Terrilee FilesZach Semaya Vida D.O.  Shoulder:   Supraspinatus: Significant calcific changes noted and around the insertion. AC joint:  Capsule undistended, no geyser sign. Glenohumeral Joint:  Appears normal without effusion. Glenoid Labrum:  Intact without visualized tears. Biceps Tendon:  Appears normal on long and transverse views, no fraying of tendon, tendon located in intertubercular groove, no subluxation with shoulder internal or external rotation.  Impression: Subacromial bursitis     Impression and Recommendations:     This case required medical decision making of moderate complexity.      Note: This dictation was prepared with Dragon dictation along with smaller phrase technology. Any transcriptional errors that result from this process are unintentional.

## 2017-08-25 ENCOUNTER — Encounter: Payer: Self-pay | Admitting: Family Medicine

## 2017-08-25 ENCOUNTER — Ambulatory Visit: Payer: 59 | Admitting: Family Medicine

## 2017-08-25 ENCOUNTER — Ambulatory Visit: Payer: Self-pay

## 2017-08-25 VITALS — BP 102/70 | HR 67 | Ht 68.0 in | Wt 119.0 lb

## 2017-08-25 DIAGNOSIS — M25511 Pain in right shoulder: Principal | ICD-10-CM

## 2017-08-25 DIAGNOSIS — M7531 Calcific tendinitis of right shoulder: Secondary | ICD-10-CM | POA: Insufficient documentation

## 2017-08-25 DIAGNOSIS — G8929 Other chronic pain: Secondary | ICD-10-CM | POA: Diagnosis not present

## 2017-08-25 MED ORDER — NITROGLYCERIN 0.2 MG/HR TD PT24: MEDICATED_PATCH | TRANSDERMAL | 1 refills | Status: DC

## 2017-08-25 NOTE — Assessment & Plan Note (Signed)
Calcific tendinitis noted.  Patient given multiple different options and has elected to try the nitroglycerin patches.  Warned of potential side effects.  Discussed icing regimen and home exercises, encourage patient to continue vitamin D supplementation, we discussed the possibility of K2.  Patient given home exercises and work with Event organiserathletic trainer.  Patient will follow-up again in 4 weeks

## 2017-08-25 NOTE — Patient Instructions (Addendum)
Good to see you  Ice 20 minutes 2 times daily. Usually after activity and before bed. Exercises 3 times a week. I would no t do it more then 5 times a week Keep hands within peripheral vision  Continue your vitamin D Consider tart cherry extract any dose at night  Nitroglycerin Protocol   Apply 1/4 nitroglycerin patch to affected area daily.  Change position of patch within the affected area every 24 hours.  You may experience a headache during the first 1-2 weeks of using the patch, these should subside.  If you experience headaches after beginning nitroglycerin patch treatment, you may take your preferred over the counter pain reliever.  Another side effect of the nitroglycerin patch is skin irritation or rash related to patch adhesive.  Please notify our office if you develop more severe headaches or rash, and stop the patch.  Tendon healing with nitroglycerin patch may require 12 to 24 weeks depending on the extent of injury.  Men should not use if taking Viagra, Cialis, or Levitra.   Do not use if you have migraines or rosacea.  I hope the manipulation helps and well and we will try to get you a new desk  See me again in 4 weeks

## 2017-09-21 NOTE — Progress Notes (Signed)
Elizabeth Carter D.O. [facility] [address] [place], [place] [address] Phone: [phone] Subjective:     CC: Right shoulder pain  [name]  Elizabeth PavlovCristina Carter is a 42 y.o. female coming in with complaint of right shoulder pain.  Patient had a calcific tendinitis in the right shoulder.  Patient started once weekly vitamin D, icing regimen, topical nitroglycerin. Discussed protein supplementations.  Patient given home exercises.  Her pain has decreased since then. Pain is still present with IR, ER and shoulder flexion.  Patient states that she can do most of her daily activities just fine.  Feels like she is improved but not completely.     No past medical history on file. Past Surgical History:  Procedure Laterality Date  . ADENOIDECTOMY     as child  . OTHER SURGICAL HISTORY     fractured hunmerus 1983.  . WISDOM TOOTH EXTRACTION  2012   Social History   Socioeconomic History  . Marital status: Married    Spouse name: None  . Number of children: None  . Years of education: None  . Highest education level: None  Social Needs  . Financial resource strain: None  . Food insecurity - worry: None  . Food insecurity - inability: None  . Transportation needs - medical: None  . Transportation needs - non-medical: None  Occupational History  . None  Tobacco Use  . Smoking status: Never Smoker  . Smokeless tobacco: Never Used  Substance and Sexual Activity  . Alcohol use: Yes    Alcohol/week: 0.6 oz    Types: 1 Standard drinks or equivalent per week  . Drug use: No  . Sexual activity: None  Other Topics Concern  . None  Social History Narrative   Married. Osborne Cascoadia '06. Husband is a hospitalist (may establish at [place])   Lives with husband and daughter.       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    No Known Allergies Family History  Problem  Relation Age of Onset  . Arthritis Mother        knee  . Hyperlipidemia Father   . Hypertension Father   . CAD Unknown        50, likely-has refused cath     Past medical history, social, surgical and family history all reviewed in electronic medical record.  No pertanent information unless stated regarding to the chief complaint.   Review of Systems:Review of systems updated and as accurate as of 09/22/17  No headache, visual changes, nausea, vomiting, diarrhea, constipation, dizziness, abdominal pain, skin rash, fevers, chills, night sweats, weight loss, swollen lymph nodes, body aches, joint swelling, muscle aches, chest pain, shortness of breath, mood changes.   Objective  Blood pressure 112/72, pulse 65, height 5\' 8"  (1.727 m), weight 121 lb (54.9 kg), SpO2 98 %. Systems examined below as of 09/22/17   General: No apparent distress alert and oriented x3 mood and affect normal, dressed appropriately.  HEENT: Pupils equal, extraocular movements intact  Respiratory: Patient's speak in full sentences and does not appear short of breath  Cardiovascular: No lower extremity edema, non tender, no erythema  Skin: Warm dry intact with no signs of infection or rash on extremities or on axial skeleton.  Abdomen: Soft nontender  Neuro: Cranial nerves II through XII are intact, neurovascularly intact in all extremities with 2+ DTRs and 2+ pulses.  Lymph: No lymphadenopathy  of posterior or anterior cervical chain or axillae bilaterally.  Gait normal with good balance and coordination.  MSK:  Non tender with full range of motion and good stability and symmetric strength and tone of  elbows, wrist, hip, knee and ankles bilaterally.  Shoulder: Right Inspection reveals no abnormalities, atrophy or asymmetry. Palpation is normal with no tenderness over AC joint or bicipital groove. ROM is full in all planes passively. Rotator cuff strength normal throughout. signs of impingement with positive Neer  and Hawkin's tests, but negative empty can sign. Speeds and Yergason's tests normal. No labral pathology noted with negative Obrien's, negative clunk and good stability. Normal scapular function observed. No painful arc and no drop arm sign. No apprehension sign  MSK US performed of: Right This study was ordered, performed, and interpreted by Terrilee Files D.O.  Shoulder:   Supraspinatus:  Appears normal on long and transverse views, Bursal bulge seen with shoulder abduction on impingement view.  Calcium still noted but improved from previous exam by approximately 30-35%. Subscapularis:  Appears normal on long and transverse views. Positive bursa . AC joint:  Capsule undistended, no geyser sign. Glenohumeral Joint:  Appears normal without effusion. Glenoid Labrum:  Intact without visualized tears. Biceps Tendon:  Appears normal on long and transverse views, no fraying of tendon, tendon located in intertubercular groove, no subluxation with shoulder internal or external rotation.  Impression: Subacromial bursitis calcific mid making improvement    Impression and Recommendations:     This case required medical decision making of moderate complexity.      Note: This dictation was prepared with Dragon dictation along with smaller phrase technology. Any transcriptional errors that result from this process are unintentional.

## 2017-09-22 ENCOUNTER — Ambulatory Visit: Payer: Self-pay

## 2017-09-22 ENCOUNTER — Encounter: Payer: Self-pay | Admitting: Family Medicine

## 2017-09-22 ENCOUNTER — Ambulatory Visit: Payer: 59 | Admitting: Family Medicine

## 2017-09-22 VITALS — BP 112/72 | HR 65 | Ht 68.0 in | Wt 121.0 lb

## 2017-09-22 DIAGNOSIS — G8929 Other chronic pain: Secondary | ICD-10-CM | POA: Diagnosis not present

## 2017-09-22 DIAGNOSIS — M25511 Pain in right shoulder: Principal | ICD-10-CM

## 2017-09-22 DIAGNOSIS — M7531 Calcific tendinitis of right shoulder: Secondary | ICD-10-CM | POA: Diagnosis not present

## 2017-09-22 NOTE — Patient Instructions (Signed)
Good to see you  Elizabeth Carter is your friend.  Check vitamin D Keep doing the exercises Send me a message in 3-4 weeks and tell me how you are doing.

## 2017-09-22 NOTE — Assessment & Plan Note (Signed)
Patient has shown some improvement.  It has been very slow.  Discussed icing regimen and home exercises.  Discussed which activities to do which wants to avoid.  Increase activity as tolerated.

## 2017-10-08 LAB — CBC AND DIFFERENTIAL
HCT: 35 — AB (ref 36–46)
Hemoglobin: 11.4 — AB (ref 12.0–16.0)
Platelets: 223 (ref 150–399)
WBC: 3.5

## 2017-10-08 LAB — BASIC METABOLIC PANEL
BUN: 17 (ref 4–21)
Creatinine: 0.6 (ref 0.5–1.1)
Glucose: 91
Potassium: 4.7 (ref 3.4–5.3)
Sodium: 141 (ref 137–147)

## 2017-10-08 LAB — LIPID PANEL
Cholesterol: 134 (ref 0–200)
HDL: 58 (ref 35–70)
LDL Cholesterol: 66
Triglycerides: 49 (ref 40–160)

## 2017-10-08 LAB — VITAMIN D 25 HYDROXY (VIT D DEFICIENCY, FRACTURES): Vit D, 25-Hydroxy: 39.9

## 2017-10-08 LAB — HEPATIC FUNCTION PANEL
ALT: 16 (ref 7–35)
AST: 19 (ref 13–35)
Alkaline Phosphatase: 67 (ref 25–125)
Bilirubin, Total: 0.4

## 2017-10-08 LAB — VITAMIN B12: Vitamin B-12: 1428

## 2017-10-08 LAB — TSH: TSH: 1.96 (ref 0.41–5.90)

## 2017-10-28 ENCOUNTER — Encounter: Payer: Self-pay | Admitting: Family Medicine

## 2017-10-28 LAB — HEMOGLOBIN A1C
Hemoglobin A1C: 5.2
T3, Free: 110
T4,Free (Direct): 1.12

## 2018-01-10 ENCOUNTER — Encounter: Payer: Self-pay | Admitting: Family Medicine

## 2018-10-20 ENCOUNTER — Encounter: Payer: Self-pay | Admitting: Family Medicine

## 2018-11-09 LAB — HEPATIC FUNCTION PANEL
ALT: 43 — AB (ref 7–35)
AST: 41 — AB (ref 13–35)
Alkaline Phosphatase: 77 (ref 25–125)
Bilirubin, Total: 0.2

## 2018-11-09 LAB — LIPID PANEL
Cholesterol: 148 (ref 0–200)
HDL: 67 (ref 35–70)
LDL Cholesterol: 66
Triglycerides: 75 (ref 40–160)

## 2018-11-09 LAB — HIV ANTIBODY (ROUTINE TESTING W REFLEX): HIV 1&2 Ab, 4th Generation: NONREACTIVE

## 2018-11-09 LAB — BASIC METABOLIC PANEL: Creatinine: 0.8 (ref 0.5–1.1)

## 2018-11-09 LAB — HEMOGLOBIN A1C: Hemoglobin A1C: 5.3

## 2019-01-23 ENCOUNTER — Encounter: Payer: Self-pay | Admitting: Family Medicine

## 2019-03-08 NOTE — Patient Instructions (Addendum)
Health Maintenance Due  Topic Date Due  . PAP SMEAR-Modifier - declines for now  07/07/2008  . TETANUS/TDAP - 2017 [facility].  Please see if you can have them send [place] a copy 07/08/2015  . INFLUENZA VACCINE - will get at [facility] send [place] a message when you complete this if possible 02/04/2019    Please stop by lab before you go If you do not have mychart- we will call you about results within 5 business days of [place] receiving them.  If you have mychart- we will send your results within 3 business days of [place] receiving them.  If abnormal or we want to clarify a result, we will call or mychart you to make sure you receive the message.  If you have questions or concerns or don't hear within 5-7 days, please send [place] a message or call us.

## 2019-03-08 NOTE — Progress Notes (Signed)
Phone: [phone]   Subjective:  Patient presents today for their annual physical. Chief complaint-noted.   See problem oriented charting- ROS- full  review of systems was completed and negative including No chest pain or shortness of breath. No headache or blurry vision.   The following were reviewed and entered/updated in epic: History reviewed. No pertinent past medical history. Patient Active Problem List   Diagnosis Date Noted  . Osteitis pubis (HCC) 10/09/2016    Priority: Low  . Vegetarian 09/12/2014    Priority: Low  . Coccydynia 09/12/2014    Priority: Low  . Health care maintenance 09/12/2014    Priority: Low  . 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 Calcific tendinitis of right shoulder 08/25/2017  . Palpitations 09/12/2014   Past Surgical History:  Procedure Laterality Date  . ADENOIDECTOMY     as child  . OTHER SURGICAL HISTORY     fractured hunmerus 1983.  . WISDOM TOOTH EXTRACTION  2012    Family History  Problem Relation Age of Onset  . Arthritis Mother        knee  . Hyperlipidemia Father   . Hypertension Father   . CAD Unknown        50, likely-has refused cath    Medications- reviewed and updated Current Outpatient Medications  Medication Sig Dispense Refill  . Cyanocobalamin (VITAMIN B-12 PO) Take by mouth.    . magnesium gluconate (MAGONATE) 500 MG tablet Take 1 tablet (500 mg total) by mouth daily.    . Multiple Vitamin (MULTIVITAMIN) tablet Take 1 tablet by mouth daily.     No current facility-administered medications for this visit.     Allergies-reviewed and updated No Known Allergies  Social History   Social History Narrative   Married. Osborne Cascoadia '06. Husband is a hospitalist (may establish at [place])   Lives with husband and daughter.       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    Objective  Objective:   BP 100/70 (BP Location: Left Arm, Patient Position: Sitting, Cuff Size: Normal)   Pulse 60   Temp 99.2 F (37.3 C) (Temporal)   Ht 5\' 8"  (1.727 m)   Wt 120 lb 9.6 oz (54.7 kg)   LMP 02/22/2019   SpO2 98%   BMI 18.34 kg/m  Gen: NAD, resting comfortably HEENT: Mucous membranes are moist. Oropharynx normal Neck: no thyromegaly or cervical lymphadenopathy CV: RRR no murmurs rubs or gallops Lungs: CTAB no crackles, wheeze, rhonchi Abdomen: soft/nontender/nondistended/normal bowel sounds. No rebound or guarding.  Ext: no edema Skin: warm, dry Neuro: grossly normal, moves all extremities, PERRLA   Assessment and Plan    43 y.o. female presenting for annual physical.  Health Maintenance counseling: 1. Anticipatory guidance: Patient counseled regarding regular dental exams -q4 months, eye exams - Dr. Randon GoldsmithLyles last seen 2016- no changes in vision since then,  avoiding smoking and second hand smoke , limiting alcohol to 1 beverage per day- 1 or less per month .   2. Risk factor reduction:  Advised patient of need for regular exercise and diet rich and fruits and vegetables to reduce risk of heart attack and stroke. Exercise- running 5x a week- 5k at end of day inside- walks 3-4 miles a few times a week outside. Diet-primarily vegetarian and vegan at times- very rare meat.  Wt Readings from Last 3 Encounters:  03/09/19 120 lb  9.6 oz (54.7 kg)  09/22/17 121 lb (54.9 kg)  08/25/17 119 lb (54 kg)  3. Immunizations/screenings/ancillary studies-had tetanus shot through health at work- will get flu shot later in the season at work and send [place] a message Immunization History  Administered Date(s) Administered  . Influenza-Unspecified 04/19/2014  . Td 07/07/2005  4. Cervical cancer screening- plans to see gynecology eventually - for now declines Pap smears.  She believes she is low risk as monogamous and prior HPV negative. 5. Breast cancer screening-  and mammogram -discussed options opted to at  least get a 3d mammogram at this time- worried with smaller breasts not able to get adequate imaging with 2d 6. Colon cancer screening - discussed colonoscopy at age 9 with change in ACS guidelines- she prefers to wait until 32 7. Skin cancer screening- dermatologist years ago. Would like to get updated exam. advised regular sunscreen use. Denies worrisome, changing, or new skin lesions.  8. Birth control/STD check- monogamous/condoms 9. Osteoporosis screening at 60- asked patient and she would like to have a bone density exam given low weight and we agreed to consider when postmenopausal- we are both hoping that is a while from now given low BMI!   She is good about getting calcium in her diet.  Does take vitamin D- 1000 units and MV  -Never smoker  Status of chronic or acute concerns   AST/ALT elevation in May though mild-update again today- done for health insurance 2-3 miles after a hefty meal- wonders if its from exercise. Rare alcohol.   Osteitis pubis- no recent issues. Shoulder has been doing well recently (right shoulder). No issues with coccydnia.   Palpitations resolved as well  Vegetarian diet-does take B12  Got a new puppy- mini dotson  With stress of covid some irregularity- now back to normal. Still using condoms.   Recommended follow up: 1 year physical  Lab/Order associations:  not fasting had a wrap   ICD-10-CM   1. Preventative health care  Z00.00 CBC    Comprehensive metabolic panel    Vitamin B12    VITAMIN D 25 Hydroxy (Vit-D Deficiency, Fractures)  2. Vegetarian  Z78.9 Vitamin B12    VITAMIN D 25 Hydroxy (Vit-D Deficiency, Fractures)  3. Osteitis pubis (HCC)  M86.9   4. Screening for breast cancer  Z12.39 MM 3D SCREEN BREAST BILATERAL  5. Adult BMI <19 kg/sq m  Z68.1   6. Elevated AST (SGOT)  R74.0 CBC    Comprehensive metabolic panel    No orders of the defined types were placed in this encounter.   Return precautions advised.  Garret Reddish, MD

## 2019-03-09 ENCOUNTER — Ambulatory Visit (INDEPENDENT_AMBULATORY_CARE_PROVIDER_SITE_OTHER): Payer: 59 | Admitting: Family Medicine

## 2019-03-09 ENCOUNTER — Encounter: Payer: Self-pay | Admitting: Family Medicine

## 2019-03-09 ENCOUNTER — Other Ambulatory Visit: Payer: Self-pay

## 2019-03-09 VITALS — BP 100/70 | HR 60 | Temp 99.2°F | Ht 68.0 in | Wt 120.6 lb

## 2019-03-09 DIAGNOSIS — R740 Nonspecific elevation of levels of transaminase and lactic acid dehydrogenase [LDH]: Secondary | ICD-10-CM

## 2019-03-09 DIAGNOSIS — M869 Osteomyelitis, unspecified: Secondary | ICD-10-CM | POA: Diagnosis not present

## 2019-03-09 DIAGNOSIS — Z789 Other specified health status: Secondary | ICD-10-CM

## 2019-03-09 DIAGNOSIS — Z681 Body mass index (BMI) 19 or less, adult: Secondary | ICD-10-CM | POA: Diagnosis not present

## 2019-03-09 DIAGNOSIS — Z1239 Encounter for other screening for malignant neoplasm of breast: Secondary | ICD-10-CM | POA: Diagnosis not present

## 2019-03-09 DIAGNOSIS — Z0000 Encounter for general adult medical examination without abnormal findings: Secondary | ICD-10-CM

## 2019-03-09 DIAGNOSIS — R7401 Elevation of levels of liver transaminase levels: Secondary | ICD-10-CM

## 2019-03-10 LAB — COMPREHENSIVE METABOLIC PANEL
ALT: 11 U/L (ref 0–35)
AST: 16 U/L (ref 0–37)
Albumin: 4.4 g/dL (ref 3.5–5.2)
Alkaline Phosphatase: 59 U/L (ref 39–117)
BUN: 25 mg/dL — ABNORMAL HIGH (ref 6–23)
CO2: 30 mEq/L (ref 19–32)
Calcium: 9.2 mg/dL (ref 8.4–10.5)
Chloride: 104 mEq/L (ref 96–112)
Creatinine, Ser: 0.58 mg/dL (ref 0.40–1.20)
GFR: 112.83 mL/min (ref >60.00)
Glucose, Bld: 91 mg/dL (ref 70–99)
Potassium: 4.5 mEq/L (ref 3.5–5.1)
Sodium: 139 mEq/L (ref 135–145)
Total Bilirubin: 0.3 mg/dL (ref 0.2–1.2)
Total Protein: 6.5 g/dL (ref 6.0–8.3)

## 2019-03-10 LAB — CBC
HCT: 37.7 % (ref 36.0–46.0)
Hemoglobin: 12.6 g/dL (ref 12.0–15.0)
MCHC: 33.5 g/dL (ref 30.0–36.0)
MCV: 90 fl (ref 78.0–100.0)
Platelets: 218 10*3/uL (ref 150.0–400.0)
RBC: 4.19 Mil/uL (ref 3.87–5.11)
RDW: 13.7 % (ref 11.5–15.5)
WBC: 5.6 10*3/uL (ref 4.0–10.5)

## 2019-03-10 LAB — VITAMIN D 25 HYDROXY (VIT D DEFICIENCY, FRACTURES): VITD: 36.67 ng/mL (ref 30.00–100.00)

## 2019-03-10 LAB — VITAMIN B12: Vitamin B-12: 899 pg/mL (ref 211–911)

## 2019-04-13 LAB — HEPATIC FUNCTION PANEL
ALT: 10 (ref 7–35)
AST: 18 (ref 13–35)
Alkaline Phosphatase: 60 (ref 25–125)

## 2019-04-13 LAB — LIPID PANEL
Cholesterol: 145 (ref 0–200)
HDL: 65 (ref 35–70)
LDL Cholesterol: 12
Triglycerides: 54 (ref 40–160)

## 2019-04-13 LAB — HEMOGLOBIN A1C: Hemoglobin A1C: 5.4

## 2019-04-20 LAB — BASIC METABOLIC PANEL
BUN: 18 (ref 4–21)
Creatinine: 0.6 (ref 0.5–1.1)
Glucose: 89
Potassium: 4.3 (ref 3.4–5.3)
Sodium: 140 (ref 137–147)

## 2019-04-25 ENCOUNTER — Other Ambulatory Visit: Payer: Self-pay

## 2019-04-25 ENCOUNTER — Ambulatory Visit (INDEPENDENT_AMBULATORY_CARE_PROVIDER_SITE_OTHER): Payer: 59

## 2019-04-25 DIAGNOSIS — Z23 Encounter for immunization: Secondary | ICD-10-CM

## 2019-05-11 ENCOUNTER — Encounter: Payer: Self-pay | Admitting: Family Medicine

## 2019-05-24 ENCOUNTER — Ambulatory Visit
Admission: RE | Admit: 2019-05-24 | Discharge: 2019-05-24 | Disposition: A | Discharge status: Home / Self Care | Payer: 59 | Source: Ambulatory Visit | Attending: Family Medicine | Admitting: Family Medicine

## 2019-05-24 ENCOUNTER — Other Ambulatory Visit: Payer: Self-pay

## 2019-05-24 DIAGNOSIS — Z1239 Encounter for other screening for malignant neoplasm of breast: Secondary | ICD-10-CM

## 2019-12-13 DIAGNOSIS — H04123 Dry eye syndrome of bilateral lacrimal glands: Secondary | ICD-10-CM | POA: Diagnosis not present

## 2020-01-10 DIAGNOSIS — Z20822 Contact with and (suspected) exposure to covid-19: Secondary | ICD-10-CM | POA: Diagnosis not present

## 2020-01-16 ENCOUNTER — Telehealth: Payer: Self-pay | Admitting: Family Medicine

## 2020-01-16 MED ORDER — BUSPIRONE HCL 5 MG PO TABS: 5.0000 mg | ORAL_TABLET | Freq: Three times a day (TID) | ORAL | 1 refills | Status: DC | PRN

## 2020-01-16 NOTE — Telephone Encounter (Signed)
Patient reports anxiety before flight to [place].  She is having severe anxiety even leading up to the flight.  Ativan has not been effective in the past-she would like to try buspirone after discussion-this was sent in

## 2020-02-21 DIAGNOSIS — Z1159 Encounter for screening for other viral diseases: Secondary | ICD-10-CM | POA: Diagnosis not present

## 2020-03-27 DIAGNOSIS — Z20828 Contact with and (suspected) exposure to other viral communicable diseases: Secondary | ICD-10-CM | POA: Diagnosis not present

## 2020-04-22 NOTE — Progress Notes (Signed)
Phone [phone]   Subjective:  Patient presents today for their annual physical. Chief complaint-noted.   See problem oriented charting- ROS- full  review of systems was completed and negative except for: fatigue/worst stress  The following were reviewed and entered/updated in epic: History reviewed. No pertinent past medical history. Patient Active Problem List   Diagnosis Date Noted  . Osteitis pubis ([place]) 10/09/2016    Priority: Low  . Vegetarian 09/12/2014    Priority: Low  . Coccydynia 09/12/2014    Priority: Low  . Health care maintenance 09/12/2014    Priority: Low  . 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 Calcific tendinitis of right shoulder 08/25/2017  . Palpitations 09/12/2014   Past Surgical History:  Procedure Laterality Date  . ADENOIDECTOMY     as child  . OTHER SURGICAL HISTORY     fractured hunmerus 1983.  . WISDOM TOOTH EXTRACTION  2012    Family History  Problem Relation Age of Onset  . Arthritis Mother        knee  . Hyperlipidemia Father   . Hypertension Father   . CAD Other        2, likely-has refused cath    Medications- reviewed and updated Current Outpatient Medications  Medication Sig Dispense Refill  . Cyanocobalamin (VITAMIN B-12 PO) Take by mouth.    . magnesium gluconate (MAGONATE) 500 MG tablet Take 1 tablet (500 mg total) by mouth daily.    . Multiple Vitamin (MULTIVITAMIN) tablet Take 1 tablet by mouth daily.    . Turmeric 400 MG CAPS Take 400 mg by mouth daily.     No current facility-administered medications for this visit.    Allergies-reviewed and updated No Known Allergies  Social History   Social History Narrative   Married. Percell Locus '06. Husband is a hospitalist (may establish at [place])   Lives with husband and daughter.       Works at [employer]. 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    Objective   Objective:  BP 116/80   Pulse 63   Temp 98.6 F (37 C) (Temporal)   Resp 18   Ht '5\' 8"'  (1.727 m)   Wt 120 lb 3.2 oz (54.5 kg)   SpO2 98%   BMI 18.28 kg/m  Gen: NAD, resting comfortably HEENT: Mucous membranes are moist. Oropharynx normal. Tm normal  Neck: no thyromegaly CV: RRR no murmurs rubs or gallops Lungs: CTAB no crackles, wheeze, rhonchi Abdomen: soft/nontender/nondistended/normal bowel sounds. No rebound or guarding.  Ext: no edema Skin: warm, dry Neuro: grossly normal, moves all extremities, PERRLA   Assessment and Plan   44 y.o. female presenting for annual physical.  Health Maintenance counseling: 1. Anticipatory guidance: Patient counseled regarding regular dental exams q4 months, eye exams- Dr. Prudencio Burly about 5 years ago/2016 and saw new physician within last year but not in system so may switch back,  avoiding smoking and second hand smoke, limiting alcohol to 1 beverage per day-1 or less per month .   2. Risk factor reduction:  Advised patient of need for regular exercise and diet rich and fruits and vegetables to reduce risk of heart attack and stroke. Exercise- Runs on treadmill 30 mins 5 days a week- watches tv. Diet-Vegan/very healthy Wt Readings from Last 3 Encounters:  04/23/20 120 lb 3.2 oz (54.5 kg)  03/09/19 120 lb 9.6 oz (54.7 kg)  09/22/17 121 lb (54.9  kg)  3. Immunizations/screenings/ancillary studies- discussed one time lifetime HCV screening- hold off for now.  Immunization History  Administered Date(s) Administered  . Hepatitis B 07/15/1999, 08/25/1999, 03/01/2000  . Influenza-Unspecified 04/19/2014, 04/18/2019, 04/22/2020  . MMR 01/05/1985, 04/05/2000  . PFIZER SARS-COV-2 Vaccination 07/04/2019, 07/25/2019, 04/06/2020  . Td 07/07/2005  . Tdap 04/25/2019  . Varicella 04/05/2000  4. Cervical cancer screening- low risk as monogamous and prior HPV negative- plans to eventually see gynecology but declines for now 5. Breast cancer screening-   and  mammogram 05/24/2019 3d- will update every other year and then probably yearly at age 49 6. Colon cancer screening -  she prefers to wait until next year- discussed acs and usptf guidelines at 44.  7. Skin cancer screening- will do dermatology referral but wants to investigate potential options. advised regular sunscreen use. Denies worrisome, changing, or new skin lesions.  8. Birth control/STD check-  Condoms. Monogamous so opts out std check.  9. Osteoporosis screening at 10- we are planning on this when postmenopausal. Does take vitamin D or  MV -gets 1000 units per day.  -Never smoker  Status of chronic or acute concerns   # Social update- daughter doing well-10th grade. Still enjoying mini dotson pruchased last year. Unfortunately dad had a stroke after moderna- within 10 days. Shower of clots. Buspirone was helpful short term one twice a day and 2 morning of flight  (had to fly to help) and stopped afterwards- feels better at this point. Dad is doing better now.    # some fatigue but a lot of stress with work turnover. Spending a ton of time on meetings and interviews. Does not feel needs labs to evaluate as seems stress induced.   Ast/alt normal on last check. I do not feel strongly about repeat at this time. This was done with different reference range and was in normal range on that report Lab Results  Component Value Date   ALT 10 04/13/2019   AST 18 04/13/2019   ALKPHOS 60 04/13/2019   BILITOT 0.3 03/09/2019   Osteitis pubis no recent issues. Shoulder doing better recently on right. No coccydynia . No more palpitations   Vegan diet- does take b12, vitamin D- rotates between this and MV  Recommended follow up: Return in about 1 year (around 04/23/2021) for physical or sooner if needed.  Lab/Order associations:Non fasting   ICD-10-CM   1. Preventative health care  F81.01 COMPLETE METABOLIC PANEL WITH GFR    CBC with Differential/Platelet    Lipid panel  2. Encounter for  hepatitis C screening test for low risk patient  Z11.59 Hepatitis C antibody    No orders of the defined types were placed in this encounter.   Return precautions advised.  Garret Reddish, MD

## 2020-04-22 NOTE — Patient Instructions (Addendum)
  Health Maintenance Due  Topic Date Due  . PAP SMEAR-Modifier Has not been done.   low risk as monogamous and prior HPV negative- plans to eventually see gynecology but declines for now  Please consider updating this 07/07/2008

## 2020-04-23 ENCOUNTER — Encounter: Payer: Self-pay | Admitting: Family Medicine

## 2020-04-23 ENCOUNTER — Other Ambulatory Visit: Payer: Self-pay

## 2020-04-23 ENCOUNTER — Ambulatory Visit (INDEPENDENT_AMBULATORY_CARE_PROVIDER_SITE_OTHER): Payer: 59 | Admitting: Family Medicine

## 2020-04-23 VITALS — BP 116/80 | HR 63 | Temp 98.6°F | Resp 18 | Ht 68.0 in | Wt 120.2 lb

## 2020-04-23 DIAGNOSIS — Z1159 Encounter for screening for other viral diseases: Secondary | ICD-10-CM | POA: Diagnosis not present

## 2020-04-23 DIAGNOSIS — Z0000 Encounter for general adult medical examination without abnormal findings: Secondary | ICD-10-CM | POA: Diagnosis not present

## 2020-09-09 DIAGNOSIS — Z1211 Encounter for screening for malignant neoplasm of colon: Secondary | ICD-10-CM | POA: Diagnosis not present

## 2020-09-25 DIAGNOSIS — Z01419 Encounter for gynecological examination (general) (routine) without abnormal findings: Secondary | ICD-10-CM | POA: Diagnosis not present

## 2020-09-25 DIAGNOSIS — N959 Unspecified menopausal and perimenopausal disorder: Secondary | ICD-10-CM | POA: Diagnosis not present

## 2020-09-25 DIAGNOSIS — Z681 Body mass index (BMI) 19 or less, adult: Secondary | ICD-10-CM | POA: Diagnosis not present

## 2020-10-09 LAB — COMPREHENSIVE METABOLIC PANEL
Albumin: 4.9 (ref 3.5–5.0)
Calcium: 9.7 (ref 8.7–10.7)
Globulin: 1.7

## 2020-10-09 LAB — CBC AND DIFFERENTIAL
HCT: 40 (ref 36–46)
Hemoglobin: 13.2 (ref 12.0–16.0)
Neutrophils Absolute: 2.4
Platelets: 207 (ref 150–399)
WBC: 4.1

## 2020-10-09 LAB — HEPATIC FUNCTION PANEL
ALT: 12 (ref 7–35)
AST: 20 (ref 13–35)
Alkaline Phosphatase: 66 (ref 25–125)
Bilirubin, Total: 0.5

## 2020-10-09 LAB — BASIC METABOLIC PANEL
BUN: 21 (ref 4–21)
CO2: 23 — AB (ref 13–22)
Chloride: 102 (ref 99–108)
Creatinine: 0.6 (ref 0.5–1.1)
Glucose: 83
Potassium: 4.7 (ref 3.4–5.3)
Sodium: 143 (ref 137–147)

## 2020-10-09 LAB — LIPID PANEL
Cholesterol: 147 (ref 0–200)
HDL: 68 (ref 35–70)
LDL Cholesterol: 68
Triglycerides: 51 (ref 40–160)

## 2020-10-09 LAB — HEMOGLOBIN A1C: Hemoglobin A1C: 5.4

## 2020-10-09 LAB — TSH: TSH: 2.44 (ref 0.41–5.90)

## 2020-10-09 LAB — CBC: RBC: 4.48 (ref 3.87–5.11)

## 2020-11-12 ENCOUNTER — Encounter: Payer: Self-pay | Admitting: Family Medicine

## 2020-11-13 DIAGNOSIS — H04123 Dry eye syndrome of bilateral lacrimal glands: Secondary | ICD-10-CM | POA: Diagnosis not present

## 2021-01-01 ENCOUNTER — Other Ambulatory Visit: Payer: Self-pay

## 2021-04-24 ENCOUNTER — Ambulatory Visit (INDEPENDENT_AMBULATORY_CARE_PROVIDER_SITE_OTHER): Payer: 59 | Admitting: Family Medicine

## 2021-04-24 ENCOUNTER — Other Ambulatory Visit: Payer: Self-pay

## 2021-04-24 ENCOUNTER — Encounter: Payer: Self-pay | Admitting: Family Medicine

## 2021-04-24 VITALS — BP 116/77 | HR 60 | Temp 98.3°F | Ht 68.0 in | Wt 121.8 lb

## 2021-04-24 DIAGNOSIS — Z0000 Encounter for general adult medical examination without abnormal findings: Secondary | ICD-10-CM | POA: Diagnosis not present

## 2021-04-24 DIAGNOSIS — M869 Osteomyelitis, unspecified: Secondary | ICD-10-CM

## 2021-04-24 DIAGNOSIS — Z789 Other specified health status: Secondary | ICD-10-CM | POA: Diagnosis not present

## 2021-04-24 MED ORDER — BUSPIRONE HCL 5 MG PO TABS: 5.0000 mg | ORAL_TABLET | Freq: Three times a day (TID) | ORAL | 1 refills | Status: AC | PRN

## 2021-04-24 NOTE — Progress Notes (Signed)
Phone [phone]   Subjective:  Patient presents today for their annual physical. Chief complaint-noted.   See problem oriented charting- ROS- full  review of systems was completed and negative except for: hot flashes  The following were reviewed and entered/updated in epic: History reviewed. No pertinent past medical history. Patient Active Problem List   Diagnosis Date Noted   Osteitis pubis ([place]) 10/09/2016    Priority: 3.   Vegetarian 09/12/2014    Priority: 3.   Coccydynia 09/12/2014    Priority: 3.   Health care maintenance 09/12/2014    Priority: 3.   Positive TB test 07/07/2005    Priority: 3.   Calcific tendinitis of right shoulder 08/25/2017   Palpitations 09/12/2014   Past Surgical History:  Procedure Laterality Date   ADENOIDECTOMY     as child   OTHER SURGICAL HISTORY     fractured hunmerus 1983.   WISDOM TOOTH EXTRACTION  2012    Family History  Problem Relation Age of Onset   Arthritis Mother        knee   Hyperlipidemia Father    Hypertension Father    CAD Other        58, likely-has refused cath    Medications- reviewed and updated Current Outpatient Medications  Medication Sig Dispense Refill   Cyanocobalamin (VITAMIN B-12 PO) Take by mouth.     magnesium gluconate (MAGONATE) 500 MG tablet Take 1 tablet (500 mg total) by mouth daily.     Turmeric 400 MG CAPS Take 400 mg by mouth daily.     Multiple Vitamin (MULTIVITAMIN) tablet Take 1 tablet by mouth daily.     No current facility-administered medications for this visit.    Allergies-reviewed and updated No Known Allergies  Social History   Social History Narrative   Married. Percell Locus '06. Husband is a hospitalist (may establish at [place])   Lives with husband and daughter.       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    Objective  Objective:  BP 116/77   Pulse 60    Temp 98.3 F (36.8 C) (Temporal)   Ht 5' 8" (1.727 m)   Wt 121 lb 12.8 oz (55.2 kg)   SpO2 97%   BMI 18.52 kg/m  Gen: NAD, resting comfortably HEENT: Mucous membranes are moist. Oropharynx normal Neck: no thyromegaly CV: RRR no murmurs rubs or gallops Lungs: CTAB no crackles, wheeze, rhonchi Abdomen: soft/nontender/nondistended/normal bowel sounds. No rebound or guarding.  Ext: no edema Skin: warm, dry Neuro: grossly normal, moves all extremities, PERRLA   Assessment and Plan   45 y.o. female presenting for annual physical.  Health Maintenance counseling: 1. Anticipatory guidance: Patient counseled regarding regular dental exams -q4 months, eye exams -Saw Dr. Manuella Ghazi again- needed new rx (worsened),  avoiding smoking and second hand smoke , limiting alcohol to 1 beverage per day -1 or less per month , no illicit drugs.   2. Risk factor reduction:  Advised patient of need for regular exercise and diet rich and fruits and vegetables to reduce risk of heart attack and stroke. Exercise- runs on treadmill 30 mins 5 days a week. Diet- vegan/very healthy- up 1 lb.  Wt Readings from Last 3 Encounters:  04/24/21 121 lb 12.8 oz (55.2 kg)  04/23/20 120 lb 3.2 oz (54.5 kg)  03/09/19 120 lb 9.6 oz (54.7 kg)  3. Immunizations/screenings/ancillary studies- discussed Omicron/Bivalent booster-had covid in  august (was very mild- dad had stroke with moderna unfortunately with prior- luckily he did ok) and Flu shot-had at work - otherwise up-to-date. -Discussed hepatitis C screening- could do with future labs  - already had [facility] Immunization History  Administered Date(s) Administered   Hepatitis B 07/15/1999, 08/25/1999, 03/01/2000   Influenza,inj,Quad PF,6+ Mos 04/23/2021   Influenza-Unspecified 04/19/2014, 04/18/2019, 04/22/2020   MMR 01/05/1985, 04/05/2000   PFIZER(Purple Top)SARS-COV-2 Vaccination 07/04/2019, 07/25/2019, 04/06/2020, 01/05/2021   Td 07/07/2005   Tdap 04/25/2019    Varicella 04/05/2000  4. Cervical cancer screening- ASCUS HPV negative- plans for 1 year repeat with Dr. Runell Gess in march 5. Breast cancer screening-  mammogram last done 05/24/2019 with a 2-year repeat planned 6. Colon cancer screening - last done 09/09/2020 with a 10-year repeat planned  7. Skin cancer screening-  didn't see dermatology last year- wants to hold off for now. advised regular sunscreen use. Denies worrisome, changing, or new skin lesions.  8. Birth control/STD check- Condoms and monogamous so opts out of STD check 9. Osteoporosis screening at 30-  we are planning on this when postmenopausal- perimenopausal. Does take vitamin D and B12 and turmeric and magnesium 10. Smoking associated screening - never smoker  Status of chronic or acute concerns   #Social update-daughter doing well now 11- wants to do medicine.   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 Also did dry needling- took a lot of time. Still tight- thinks time and exercises .   # B12 deficiency S: Current treatment/medication (oral vs. IM): Vitamin B-12 1000 units  every other day Lab Results  Component Value Date   VITAMINB12 899 03/09/2019  A/P: every other day 1000 units-  wants to hold off on 1k units  #wants to hold off on iron studies because getting less frequent menstruation  #Osteitis Pubis- Osteitis pubis no recent issues- walked in [place] and only had issues for 2 days when got back  #anxiety with travel- [place] in April and later [place]- uses buspirone prior to rip- we will refill for her today  Recommended follow up: Return in about 1 year (around 04/24/2022) for a physical or sooner if needed.  Lab/Order associations: no labs   ICD-10-CM   1. Preventative health care  Z00.00     2. Vegetarian  Z78.9     3. Osteitis pubis (HCC) Chronic M86.9      No orders of the defined  types were placed in this encounter.  I,Harris Phan,acting as a Education administrator for Garret Reddish, MD.,have documented all relevant documentation on the behalf of Garret Reddish, MD,as directed by  Garret Reddish, MD while in the presence of Garret Reddish, MD.  I, Garret Reddish, MD, have reviewed all documentation for this visit. The documentation on 04/24/21 for the exam, diagnosis, procedures, and orders are all accurate and complete.   Return precautions advised.  Garret Reddish, MD

## 2021-04-24 NOTE — Patient Instructions (Addendum)
Health Maintenance Due  Topic Date Due   Hepatitis C Screening  consider with future labs Never done   PAP SMEAR-Modifier  - Sign release of information at the check out desk for PAP Smear from Dr. Vincente Poli.  07/07/2008   COVID-19 Vaccine (4 - Booster for [employer] series) - Recommend getting Omicron/Bivalent booster only at your local pharmacy! Please let us know when you have received this vaccination.  06/01/2020   No labs today.   Recommended follow up: Return in about 1 year (around 04/24/2022) for a physical or sooner if needed.

## 2021-10-29 DIAGNOSIS — Z01419 Encounter for gynecological examination (general) (routine) without abnormal findings: Secondary | ICD-10-CM | POA: Diagnosis not present

## 2021-10-29 DIAGNOSIS — N951 Menopausal and female climacteric states: Secondary | ICD-10-CM | POA: Diagnosis not present

## 2021-10-29 DIAGNOSIS — Z1151 Encounter for screening for human papillomavirus (HPV): Secondary | ICD-10-CM | POA: Diagnosis not present

## 2021-10-29 DIAGNOSIS — Z124 Encounter for screening for malignant neoplasm of cervix: Secondary | ICD-10-CM | POA: Diagnosis not present

## 2021-10-29 DIAGNOSIS — Z681 Body mass index (BMI) 19 or less, adult: Secondary | ICD-10-CM | POA: Diagnosis not present

## 2021-10-29 LAB — HM PAP SMEAR

## 2021-11-10 IMAGING — MG DIGITAL SCREENING BILAT W/ TOMO W/ CAD
8 series · 9 of 24 positions shown · non-contrast
Comparison: None.

CLINICAL DATA: Screening.

EXAM:
DIGITAL SCREENING BILATERAL MAMMOGRAM WITH TOMO AND CAD

[R CC synth-2D]
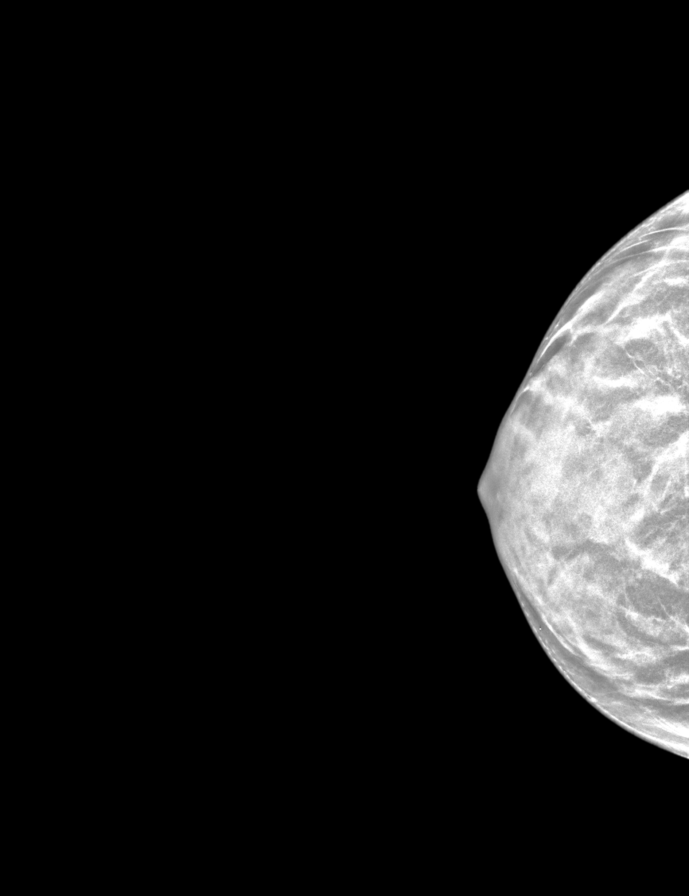

[L MLO synth-2D]
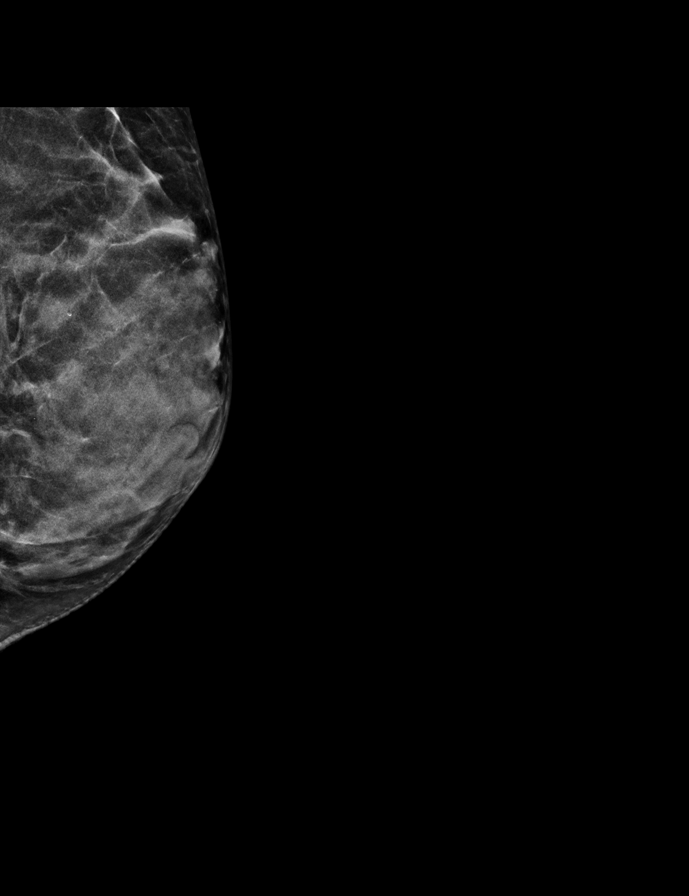

[L CC synth-2D]
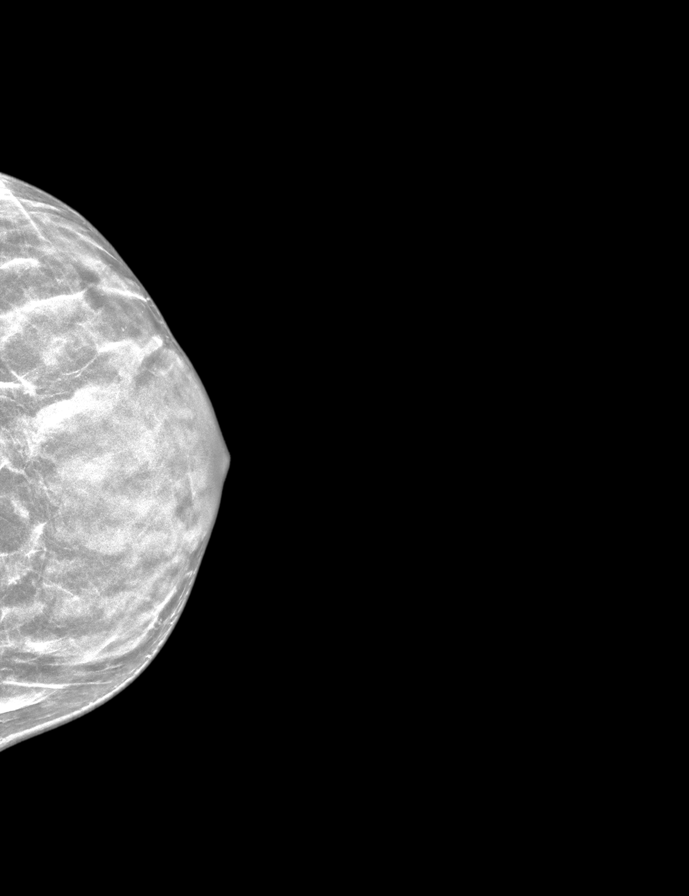

[R MLO synth-2D]
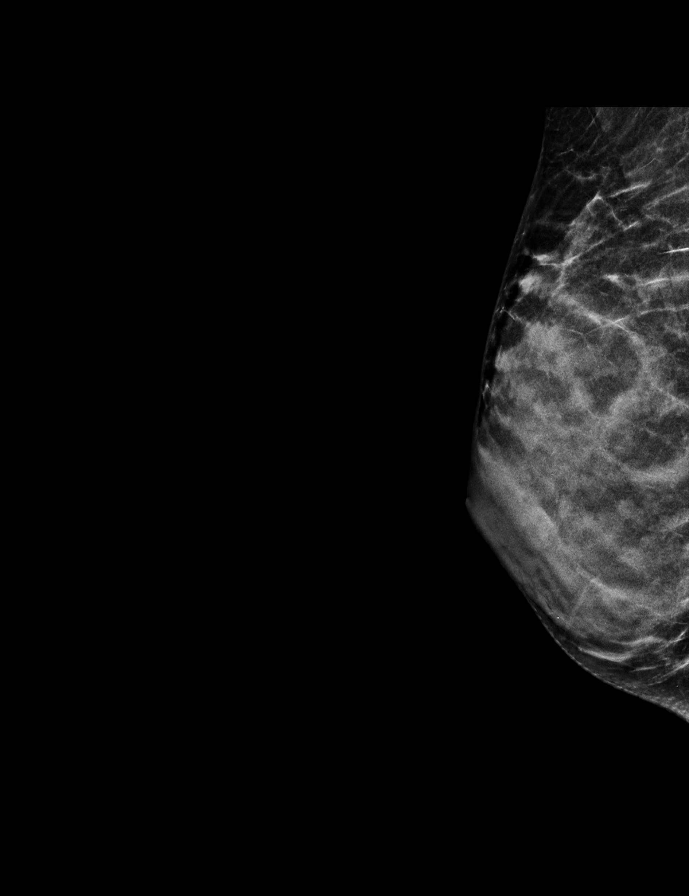

[L CC tomo · 2 of 42 frames shown]
[frame 14/42]
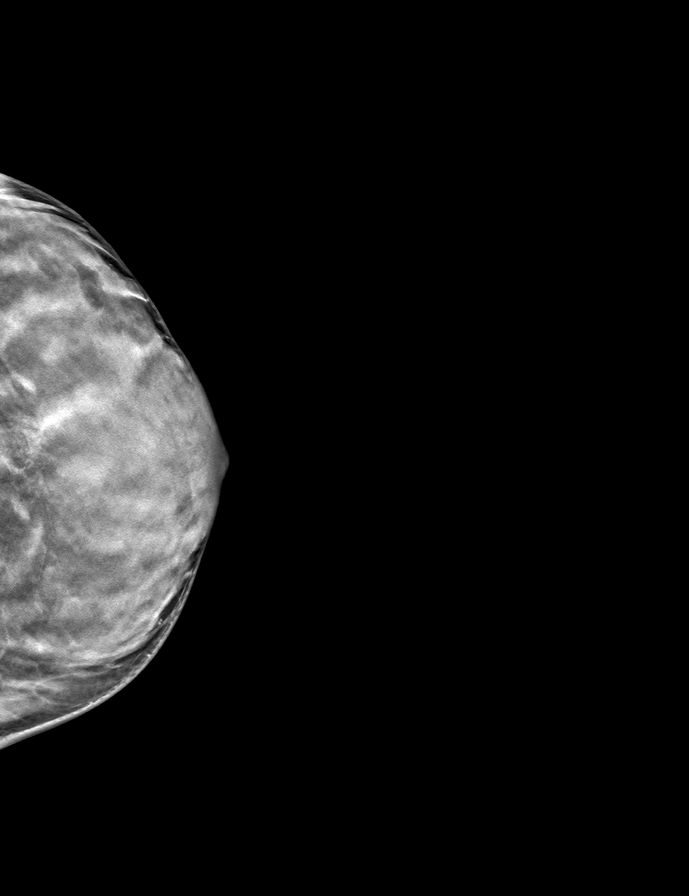
[frame 21/42]
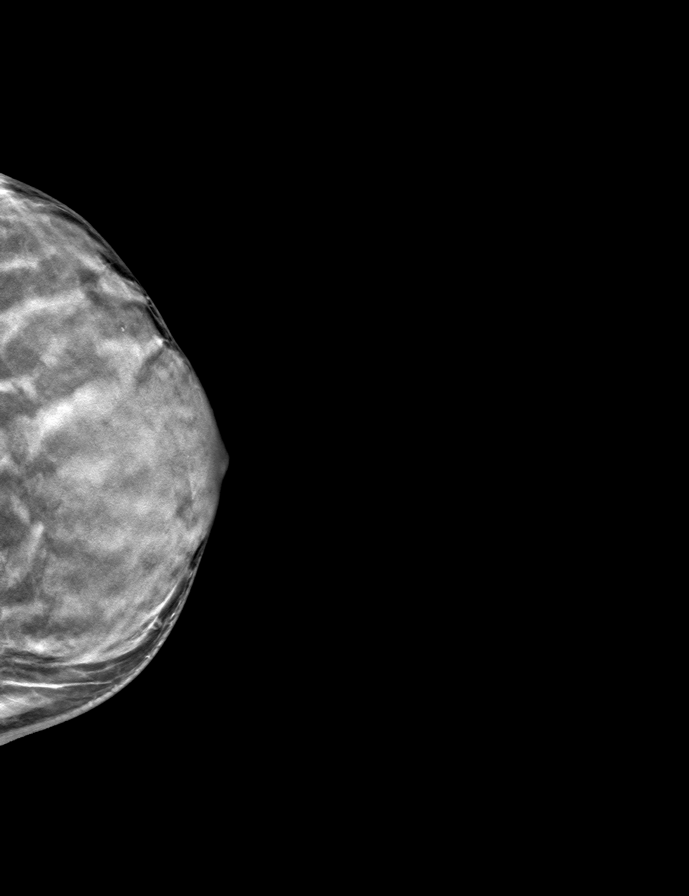

[R MLO tomo · tomo slice 20/39.0]
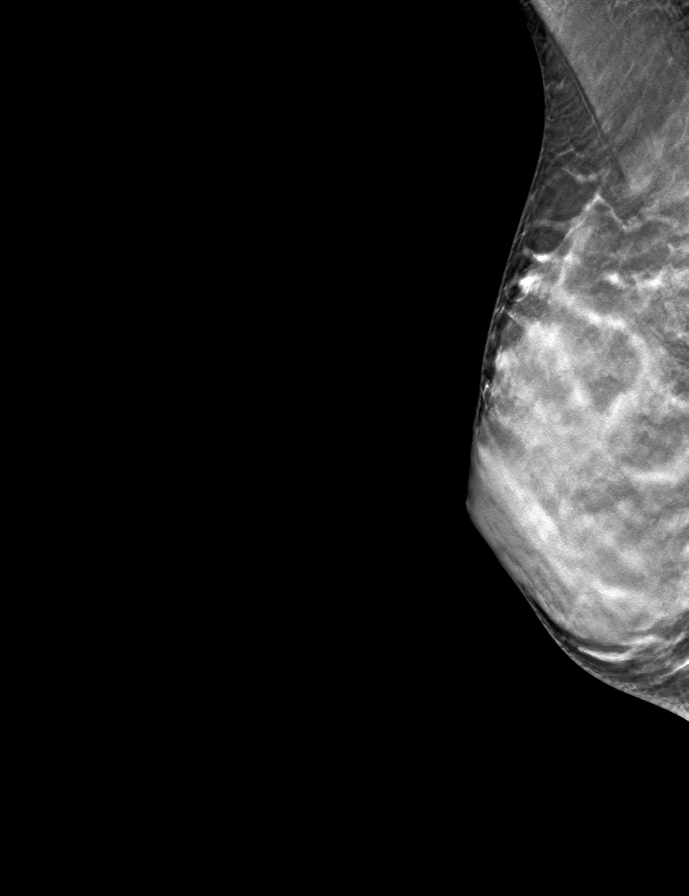

[R CC tomo · tomo slice 21/41.0]
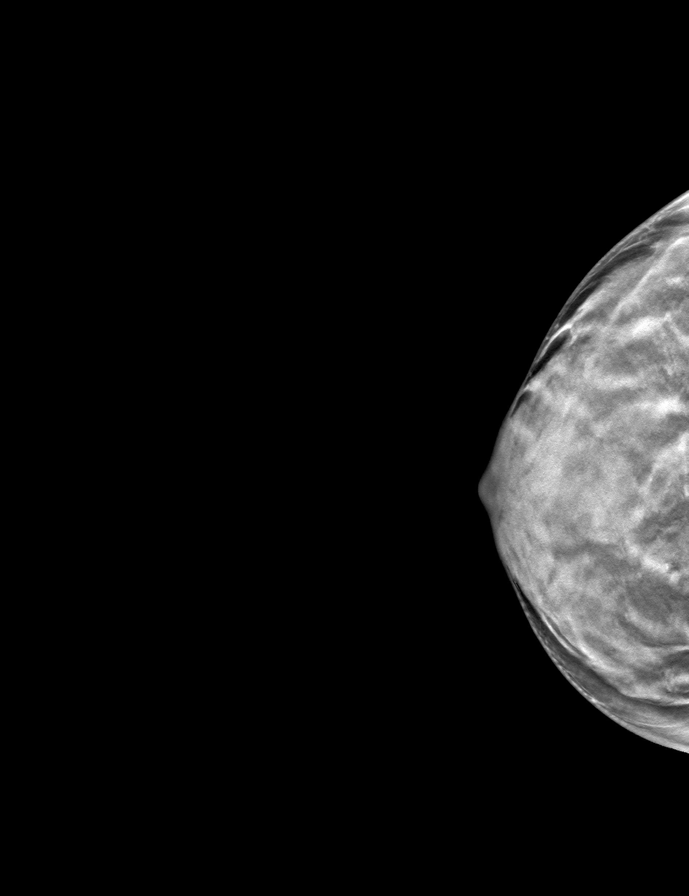

[L MLO tomo · tomo slice 22/43.0]
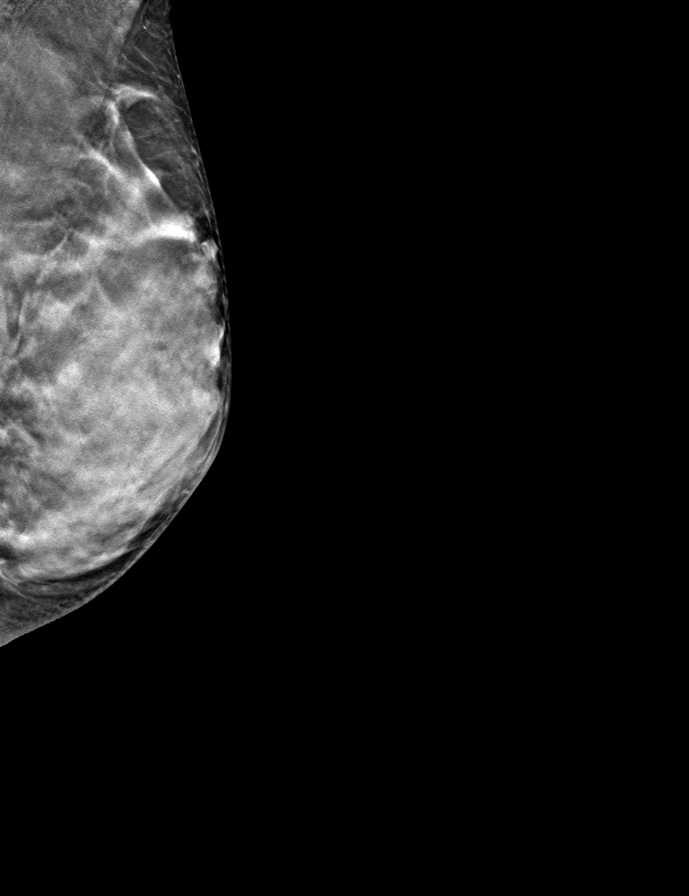

[9 of 24 positions shown; findings below may reference images not displayed]

ACR Breast Density Category d: The breast tissue is extremely dense,
which lowers the sensitivity of mammography.
FINDINGS: There are no findings suspicious for malignancy. Images were
processed with CAD.
IMPRESSION: No mammographic evidence of malignancy. A result letter of this
screening mammogram will be mailed directly to the patient.

RECOMMENDATION:
Screening mammogram in one year. (Code:F8-6-TBV)

BI-RADS CATEGORY  1: Negative.

## 2022-03-04 DIAGNOSIS — Z1231 Encounter for screening mammogram for malignant neoplasm of breast: Secondary | ICD-10-CM | POA: Diagnosis not present

## 2022-03-04 LAB — HM MAMMOGRAPHY

## 2022-03-30 ENCOUNTER — Encounter: Payer: Self-pay | Admitting: *Deleted

## 2022-04-30 ENCOUNTER — Encounter: Payer: Self-pay | Admitting: Family Medicine

## 2022-04-30 ENCOUNTER — Ambulatory Visit (INDEPENDENT_AMBULATORY_CARE_PROVIDER_SITE_OTHER): Payer: 59 | Admitting: Family Medicine

## 2022-04-30 VITALS — BP 102/74 | HR 58 | Temp 98.1°F | Ht 68.0 in | Wt 120.2 lb

## 2022-04-30 DIAGNOSIS — E611 Iron deficiency: Secondary | ICD-10-CM

## 2022-04-30 DIAGNOSIS — Z789 Other specified health status: Secondary | ICD-10-CM | POA: Diagnosis not present

## 2022-04-30 DIAGNOSIS — R739 Hyperglycemia, unspecified: Secondary | ICD-10-CM

## 2022-04-30 DIAGNOSIS — Z1159 Encounter for screening for other viral diseases: Secondary | ICD-10-CM | POA: Diagnosis not present

## 2022-04-30 DIAGNOSIS — Z0000 Encounter for general adult medical examination without abnormal findings: Secondary | ICD-10-CM | POA: Diagnosis not present

## 2022-04-30 NOTE — Progress Notes (Signed)
Phone [phone]   Subjective:  Patient presents today for their annual physical. Chief complaint-noted.   See problem oriented charting- ROS- full  review of systems was completed and negative except for: concern for higher sugars  The following were reviewed and entered/updated in epic: History reviewed. No pertinent past medical history. Patient Active Problem List   Diagnosis Date Noted   Osteitis pubis ([place]) 10/09/2016    Priority: Low   Vegan diet 09/12/2014    Priority: Low   Coccydynia 09/12/2014    Priority: [place] care maintenance 09/12/2014    Priority: Low   Positive TB test 07/07/2005    Priority: Low   Iron deficiency 04/30/2022   Calcific tendinitis of right shoulder 08/25/2017   Palpitations 09/12/2014   Past Surgical History:  Procedure Laterality Date   ADENOIDECTOMY     as child   OTHER SURGICAL HISTORY     fractured hunmerus 1983.   WISDOM TOOTH EXTRACTION  2012    Family History  Problem Relation Age of Onset   Arthritis Mother        knee   Hyperlipidemia Father    Hypertension Father    CAD Other        64, likely-has refused cath    Medications- reviewed and updated Current Outpatient Medications  Medication Sig Dispense Refill   Cyanocobalamin (VITAMIN B-12 PO) Take by mouth.     estradiol (VIVELLE-DOT) 0.05 MG/24HR patch Per Dr. Runell Gess- apply 1 patch twice a week     magnesium gluconate (MAGONATE) 500 MG tablet Take 1 tablet (500 mg total) by mouth daily.     busPIRone (BUSPAR) 5 MG tablet Take 1 tablet (5 mg total) by mouth 3 (three) times daily as needed. (Patient not taking: Reported on 04/30/2022) 90 tablet 1   progesterone (PROMETRIUM) 100 MG capsule Per Dr. Runell Gess- takes daily     Turmeric 400 MG CAPS Take 400 mg by mouth daily. (Patient not taking: Reported on 04/30/2022)     No current facility-administered medications for this visit.    Allergies-reviewed and updated No Known Allergies  Social History    Social History Narrative   Married. Percell Locus '06. Husband is a hospitalist (may establish at [place])   Lives with husband and daughter.       Works at [employer]. 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    Objective  Objective:  BP 102/74   Pulse (!) 58   Temp 98.1 F (36.7 C)   Ht '5\' 8"'  (1.727 m)   Wt 120 lb 3.2 oz (54.5 kg)   SpO2 98%   BMI 18.28 kg/m  Gen: NAD, resting comfortably HEENT: Mucous membranes are moist. Oropharynx normal Neck: no thyromegaly CV: RRR no murmurs rubs or gallops Lungs: CTAB no crackles, wheeze, rhonchi Abdomen: soft/nontender/nondistended/normal bowel sounds. No rebound or guarding.  Ext: no edema Skin: warm, dry Neuro: grossly normal, moves all extremities, PERRLA   Assessment and Plan   46 y.o. female presenting for annual physical.  Health Maintenance counseling: 1. Anticipatory guidance: Patient counseled regarding regular dental exams -q4 months, eye exams-follows with optho,  avoiding smoking and second hand smoke , limiting alcohol to 1 beverage per day-1 or less per month , no illicit drugs .   2. Risk factor reduction:  Advised patient of need for regular exercise and diet rich and fruits and vegetables to reduce risk of heart attack and stroke.  Exercise-still using treadmill regularly 30 minutes 4 days a week plus 4 sessions a week weight training Diet/weight management-vegan/very healthy diet.  Wt Readings from Last 3 Encounters:  04/30/22 120 lb 3.2 oz (54.5 kg)  04/24/21 121 lb 12.8 oz (55.2 kg)  04/23/20 120 lb 3.2 oz (54.5 kg)  3. Immunizations/screenings/ancillary studies-had flu shot at work yesterday, COVID-19 vaccination- undecided, hepatitis C screening- update with labs  Immunization History  Administered Date(s) Administered   Hepatitis B 07/15/1999, 08/25/1999, 03/01/2000   Influenza,inj,Quad PF,6+ Mos 04/23/2021, 04/29/2022    Influenza-Unspecified 04/19/2014, 04/18/2019, 04/22/2020   MMR 01/05/1985, 04/05/2000   PFIZER(Purple Top)SARS-COV-2 Vaccination 07/04/2019, 07/25/2019, 04/06/2020, 01/05/2021   Td 07/07/2005   Tdap 04/25/2019   Varicella 04/05/2000  4. Cervical cancer screening- 10/29/2021 with Dr. Salli Real will get records 5. Breast cancer screening-  breast exam with GYN and mammogram -03/04/2022 three 3D mammogram 6. Colon cancer screening - 09/09/2020 with 10-year repeat with [facility] 7. Skin cancer screening- no dermatologist- plans to schedule.e advised regular sunscreen use. Denies worrisome, changing, or new skin lesions.  8. Birth control/STD check-  postmenopausal now but still using condoms and monogamous 9. Osteoporosis screening at 16- hold off for now - since now on HRT- still takes vitamin D- tries to get calcium in diet 10. Smoking associated screening -never smoker  Status of chronic or acute concerns   #Hormone replacement therapy-on medication through Dr. Runell Gess -sleeping much better with this, aches and pains better, exercise easier -has made substantial improvement in overall quality of life  #Insulin resistance concern -- gained 2 lbs initially then lost later with some changes -had cgm in October or November last year and had some 200s after lunch and confirmed with glucometer - checked a1c and was 5.2 -put another CGM machine and noted as high as 220- and up to 230 after a particular lunch- did not check with glucometer -worse at work than at home- and worsens through the week -less peak if stays hydrated -has cut down on dessert and thinks things have improved - more recently checked and was in 150s range -labs as below- concern for Type I   #Cervicalgia-chiropractor and dry needling not very effective in the past-recently saw a Dr. Micheline Chapman and very good experience - no recurrence after treatment  # B12 concern on vegan- Takes B12 every other day 1000 mcg-  update b12 today -also with diet check vitamin D  #Osteitis pubis-thankfully no recent issues  #Anxiety with travel-buspirone can be refilled as needed-has not needed much  Recommended follow up: Return in about 1 year (around 05/01/2023) for physical or sooner if needed.Schedule b4 you leave.  Lab/Order associations:NOT fasting   ICD-10-CM   1. Preventative health care  Z00.00 HgB A1c    Comprehensive metabolic panel    C-peptide    GAD65, IA-2, and Insulin Autoantibody serum    ZNT8 Antibodies    Vitamin B12    VITAMIN D 25 Hydroxy (Vit-D Deficiency, Fractures)    IBC + Ferritin    2. Vegan diet  Z78.9 Vitamin B12    VITAMIN D 25 Hydroxy (Vit-D Deficiency, Fractures)    3. Iron deficiency  E61.1 IBC + Ferritin    4. Hyperglycemia  R73.9 HgB A1c    Comprehensive metabolic panel    C-peptide    GAD65, IA-2, and Insulin Autoantibody serum    ZNT8 Antibodies    5. Encounter for hepatitis C screening test for low risk patient  Z11.59 Hepatitis C antibody  No orders of the defined types were placed in this encounter.  Return precautions advised.  Garret Reddish, MD

## 2022-04-30 NOTE — Patient Instructions (Addendum)
Please stop by lab before you go If you have mychart- we will send your results within 3 business days of [place] receiving them.  If you do not have mychart- we will call you about results within 5 business days of [place] receiving them.  *please also note that you will see labs on mychart as soon as they post. I will later go in and write notes on them- will say "notes from Dr. Yong Channel"   Sign release of information at the check out desk for last pap   Recommended follow up: Return in about 1 year (around 05/01/2023) for physical or sooner if needed.Schedule b4 you leave.

## 2022-05-01 LAB — IBC + FERRITIN
Ferritin: 9.5 ng/mL — ABNORMAL LOW (ref 10.0–291.0)
Iron: 47 ug/dL (ref 42–145)
Saturation Ratios: 12.2 % — ABNORMAL LOW (ref 20.0–50.0)
TIBC: 386.4 ug/dL (ref 250.0–450.0)
Transferrin: 276 mg/dL (ref 212.0–360.0)

## 2022-05-01 LAB — COMPREHENSIVE METABOLIC PANEL
ALT: 12 U/L (ref 0–35)
AST: 13 U/L (ref 0–37)
Albumin: 4.8 g/dL (ref 3.5–5.2)
Alkaline Phosphatase: 59 U/L (ref 39–117)
BUN: 28 mg/dL — ABNORMAL HIGH (ref 6–23)
CO2: 32 mEq/L (ref 19–32)
Calcium: 10 mg/dL (ref 8.4–10.5)
Chloride: 106 mEq/L (ref 96–112)
Creatinine, Ser: 0.56 mg/dL (ref 0.40–1.20)
GFR: 108.75 mL/min (ref >60.00)
Glucose, Bld: 97 mg/dL (ref 70–99)
Potassium: 4.2 mEq/L (ref 3.5–5.1)
Sodium: 146 mEq/L — ABNORMAL HIGH (ref 135–145)
Total Bilirubin: 0.3 mg/dL (ref 0.2–1.2)
Total Protein: 6.7 g/dL (ref 6.0–8.3)

## 2022-05-01 LAB — VITAMIN B12: Vitamin B-12: 469 pg/mL (ref 211–911)

## 2022-05-01 LAB — HEMOGLOBIN A1C: Hgb A1c MFr Bld: 5.5 % (ref 4.6–6.5)

## 2022-05-01 LAB — VITAMIN D 25 HYDROXY (VIT D DEFICIENCY, FRACTURES): VITD: 30.94 ng/mL (ref 30.00–100.00)

## 2022-05-08 LAB — GAD65, IA-2, AND INSULIN AUTOANTIBODY SERUM
Glutamic Acid Decarb Ab: <5 IU/mL (ref <5)
IA-2 Antibody: <5.4 U/mL (ref <5.4)
Insulin Antibodies, Human: <0.4 U/mL (ref <0.4)

## 2022-05-08 LAB — HEPATITIS C ANTIBODY: Hepatitis C Ab: NONREACTIVE

## 2022-05-08 LAB — ZNT8 ANTIBODIES: ZNT8 Antibodies: <10 U/mL (ref <15)

## 2022-05-08 LAB — C-PEPTIDE: C-Peptide: 1.56 ng/mL (ref 0.80–3.85)

## 2022-05-09 ENCOUNTER — Encounter: Payer: Self-pay | Admitting: Family Medicine

## 2022-05-12 ENCOUNTER — Telehealth: Payer: Self-pay | Admitting: Family Medicine

## 2022-05-12 NOTE — Telephone Encounter (Signed)
See below

## 2022-05-12 NOTE — Telephone Encounter (Signed)
Patient returned Zamari's call and requests to be called at ph# [phone]

## 2022-08-28 LAB — COMPREHENSIVE METABOLIC PANEL
Calcium: 9.5 (ref 8.7–10.7)
Globulin: 1.5

## 2022-08-28 LAB — BASIC METABOLIC PANEL
BUN: 14 (ref 4–21)
CO2: 23 — AB (ref 13–22)
Chloride: 102 (ref 99–108)
Creatinine: 0.5 (ref 0.5–1.1)
Glucose: 84
Potassium: 4.5 mEq/L (ref 3.5–5.1)
Sodium: 142 (ref 137–147)

## 2022-08-28 LAB — HEPATIC FUNCTION PANEL
ALT: 12 U/L (ref 7–35)
AST: 19 (ref 13–35)
Alkaline Phosphatase: 61 (ref 25–125)
Bilirubin, Total: 0.5

## 2022-08-28 LAB — CBC AND DIFFERENTIAL
HCT: 40 (ref 36–46)
Hemoglobin: 13.3 (ref 12.0–16.0)
WBC: 4.1

## 2022-08-28 LAB — LIPID PANEL
Cholesterol: 160 (ref 0–200)
HDL: 69 (ref 35–70)
LDL Cholesterol: 81
Triglycerides: 44 (ref 40–160)

## 2022-08-28 LAB — HEMOGLOBIN A1C: Hemoglobin A1C: 5.3

## 2022-08-28 LAB — CBC: RBC: 4.41 (ref 3.87–5.11)

## 2022-08-28 LAB — TSH: TSH: 1.74 (ref 0.41–5.90)

## 2022-10-17 ENCOUNTER — Encounter: Payer: Self-pay | Admitting: Family Medicine

## 2022-10-19 NOTE — Telephone Encounter (Signed)
See below, labs have been abstracted. 

## 2022-11-04 DIAGNOSIS — Z01419 Encounter for gynecological examination (general) (routine) without abnormal findings: Secondary | ICD-10-CM | POA: Diagnosis not present

## 2022-11-04 DIAGNOSIS — N951 Menopausal and female climacteric states: Secondary | ICD-10-CM | POA: Diagnosis not present

## 2022-11-04 DIAGNOSIS — Z681 Body mass index (BMI) 19 or less, adult: Secondary | ICD-10-CM | POA: Diagnosis not present

## 2023-02-22 ENCOUNTER — Other Ambulatory Visit: Payer: Self-pay | Admitting: Oncology

## 2023-02-22 DIAGNOSIS — Z006 Encounter for examination for normal comparison and control in clinical research program: Secondary | ICD-10-CM

## 2023-02-24 DIAGNOSIS — L821 Other seborrheic keratosis: Secondary | ICD-10-CM | POA: Diagnosis not present

## 2023-02-24 DIAGNOSIS — I820 Budd-Chiari syndrome: Secondary | ICD-10-CM | POA: Diagnosis not present

## 2023-02-24 DIAGNOSIS — D225 Melanocytic nevi of trunk: Secondary | ICD-10-CM | POA: Diagnosis not present

## 2023-02-24 DIAGNOSIS — Z1283 Encounter for screening for malignant neoplasm of skin: Secondary | ICD-10-CM | POA: Diagnosis not present

## 2023-02-25 ENCOUNTER — Other Ambulatory Visit (HOSPITAL_COMMUNITY)
Admission: RE | Admit: 2023-02-25 | Discharge: 2023-02-25 | Disposition: A | Discharge status: Home / Self Care | Payer: Self-pay | Source: Ambulatory Visit | Attending: Oncology | Admitting: Oncology

## 2023-02-25 ENCOUNTER — Other Ambulatory Visit: Payer: Commercial Managed Care - PPO

## 2023-02-25 DIAGNOSIS — Z006 Encounter for examination for normal comparison and control in clinical research program: Secondary | ICD-10-CM

## 2023-02-25 DIAGNOSIS — Z1379 Encounter for other screening for genetic and chromosomal anomalies: Secondary | ICD-10-CM

## 2023-02-25 LAB — HELIX MOLECULAR SCREEN

## 2023-03-10 ENCOUNTER — Encounter: Payer: Self-pay | Admitting: Family Medicine

## 2023-03-10 DIAGNOSIS — Z1231 Encounter for screening mammogram for malignant neoplasm of breast: Secondary | ICD-10-CM | POA: Diagnosis not present

## 2023-03-10 LAB — HM MAMMOGRAPHY

## 2023-03-24 DIAGNOSIS — L821 Other seborrheic keratosis: Secondary | ICD-10-CM | POA: Diagnosis not present

## 2023-04-07 LAB — HELIX MOLECULAR SCREEN: Genetic Analysis Overall Interpretation: NEGATIVE

## 2023-05-06 ENCOUNTER — Encounter: Payer: 59 | Admitting: Family Medicine

## 2023-06-07 ENCOUNTER — Encounter: Payer: Self-pay | Admitting: Family Medicine

## 2023-06-07 ENCOUNTER — Ambulatory Visit (INDEPENDENT_AMBULATORY_CARE_PROVIDER_SITE_OTHER): Payer: 59 | Admitting: Family Medicine

## 2023-06-07 VITALS — BP 98/56 | HR 55 | Temp 97.3°F | Ht 68.0 in | Wt 120.8 lb

## 2023-06-07 DIAGNOSIS — Z0000 Encounter for general adult medical examination without abnormal findings: Secondary | ICD-10-CM | POA: Diagnosis not present

## 2023-06-07 NOTE — Progress Notes (Signed)
Phone [phone]   Subjective:  Patient presents today for their annual physical. Chief complaint-noted.   See problem oriented charting- ROS- full  review of systems was completed and negative Per full ROS sheet completed by patient   The following were reviewed and entered/updated in epic: History reviewed. No pertinent past medical history. Patient Active Problem List   Diagnosis Date Noted   Osteitis pubis (HCC) 10/09/2016    Priority: Low   Vegan diet 09/12/2014    Priority: Low   Coccydynia 09/12/2014    Priority: Low   Health care maintenance 09/12/2014    Priority: Low   Positive TB test 07/07/2005    Priority: Low   Iron deficiency 04/30/2022   Calcific tendinitis of right shoulder 08/25/2017   Palpitations 09/12/2014   Past Surgical History:  Procedure Laterality Date   ADENOIDECTOMY     as child   OTHER SURGICAL HISTORY     fractured hunmerus 1983.   WISDOM TOOTH EXTRACTION  2012   Family History  Problem Relation Age of Onset   Arthritis Mother        knee   Hyperlipidemia Father    Hypertension Father    CAD Other        72, likely-has refused cath   Medications- reviewed and updated Current Outpatient Medications  Medication Sig Dispense Refill   Cyanocobalamin (VITAMIN B-12 PO) Take by mouth.     estradiol (VIVELLE-DOT) 0.05 MG/24HR patch Per Dr. Adalberto Ill- apply 1 patch twice a week     magnesium gluconate (MAGONATE) 500 MG tablet Take 1 tablet (500 mg total) by mouth daily.     progesterone (PROMETRIUM) 100 MG capsule Per Dr. Adalberto Ill- takes daily     Turmeric 400 MG CAPS Take 400 mg by mouth daily.     busPIRone (BUSPAR) 5 MG tablet Take 1 tablet (5 mg total) by mouth 3 (three) times daily as needed. (Patient not taking: Reported on 06/07/2023) 90 tablet 1   No current facility-administered medications for this visit.    Allergies-reviewed and updated No Known Allergies  Social History   Social History Narrative   Married. Osborne Casco '06[facility]. Husband is a hospitalist (may establish at [place])   Lives with husband and daughter.       Works at [employer]. 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    Objective  Objective:  BP (!) 98/56   Pulse (!) 55   Temp (!) 97.3 F (36.3 C)   Ht 5\' 8"  (1.727 m)   Wt 120 lb 12.8 oz (54.8 kg)   SpO2 98%   BMI 18.37 kg/m  Gen: NAD, resting comfortably HEENT: Mucous membranes are moist. Oropharynx normal Neck: no thyromegaly CV: RRR no murmurs rubs or gallops Lungs: CTAB no crackles, wheeze, rhonchi Abdomen: soft/nontender/nondistended/normal bowel sounds. No rebound or guarding.  Ext: no edema Skin: warm, dry Neuro: grossly normal, moves all extremities, PERRLA   Assessment and Plan   47 y.o. female presenting for annual physical.  Health Maintenance counseling: 1. Anticipatory guidance: Patient counseled regarding regular dental exams -q4 months, eye exams -trying to get back in with Dr. Randon Goldsmith,  avoiding smoking and second hand smoke , limiting alcohol to 1 beverage per day- very rare alcohol , no illicit drugs .   2. Risk factor reduction:  Advised patient of need for regular exercise and diet rich and fruits and vegetables to reduce risk of heart  attack and stroke.  Exercise-  last year on treadmill 30 minutes 4 days a week plus weight training- currently maintaining  Diet/weight management-weight stable from last year- encouraged mild weight gain.  Wt Readings from Last 3 Encounters:  06/07/23 120 lb 12.8 oz (54.8 kg)  04/30/22 120 lb 3.2 oz (54.5 kg)  04/24/21 121 lb 12.8 oz (55.2 kg)  3. Immunizations/screenings/ancillary studies- up to date other than holding off on COVID vaccination  Immunization History  Administered Date(s) Administered   Hepatitis B 07/15/1999, 08/25/1999, 03/01/2000   Influenza,inj,Quad PF,6+ Mos 04/23/2021, 04/29/2022, 04/20/2023   Influenza-Unspecified  04/19/2014, 04/18/2019, 04/22/2020   MMR 01/05/1985, 04/05/2000   PFIZER(Purple Top)SARS-COV-2 Vaccination 07/04/2019, 07/25/2019, 04/06/2020, 01/05/2021   Td 07/07/2005   Tdap 04/25/2019   Varicella 04/05/2000  4. Cervical cancer screening- 10/29/2021 with Dr. Adalberto Ill - plans for yearly still but luckily reassuring exam due to ascus history  5. Breast cancer screening-  breast exam with GYN and mammogram -03/10/23 three 3D mammogram 6. Colon cancer screening - 09/09/2020 with 10-year repeat with [facility] 7. Skin cancer screening- saw Dr. Charlton Haws within last year- cryotherapy was expensive- may do just skin exams.  advised regular sunscreen use. Denies worrisome, changing, or new skin lesions.  8. Birth control/STD check-  postmenopausal now but still using condoms and monogamous 9. Osteoporosis screening at 65- hold off for now - since now on HRT- still takes vitamin D- tries to get calcium in diet 10. Smoking associated screening -never smoker  Status of chronic or acute concerns   #hormone replacement therapy through Dr. Adalberto Ill- still finding very helpful- sleep in particular other than stress by end of work week - does not feel anxious during work but just needs time to herself at end of week as introvert -we discussed therapy or life coaching- can refer if needed- may help to do this focused with someone who can help her with introvert tendencies  #insulin resistance- labs last year without clear issue- a1c stable Lab Results  Component Value Date   HGBA1C 5.3 08/28/2022   HGBA1C 5.5 04/30/2022   HGBA1C 5.4 10/09/2020   #B12- takes B12 every other day on vegan diet   #anxiety with travel- buspirone if needed but not needing much  #screening hyperlipidemia - Low Density Lipoprotein (LDL cholesterol) up to 81 but still very well controlled overall.  The 10-year ASCVD risk score (Arnett DK, et al., 2019) is: 0.3%* (Cholesterol units were assumed)  Recommended follow  up: Return in about 1 year (around 06/06/2024) for physical or sooner if needed.Schedule b4 you leave.  Lab/Order associations: previously high lipids   ICD-10-CM   1. Preventative health care  Z00.00       No orders of the defined types were placed in this encounter.   Return precautions advised.  Tana Conch, MD

## 2023-06-07 NOTE — Patient Instructions (Addendum)
Glad you are doing well overall- I do think therapy or life coaching proactively- focused on the introvert specific dilemmas   Recommended follow up: Return in about 1 year (around 06/06/2024) for physical or sooner if needed.Schedule b4 you leave.

## 2023-10-27 LAB — CBC: RBC: 4.28 (ref 3.87–5.11)

## 2023-10-27 LAB — LIPID PANEL
Cholesterol: 169 (ref 0–200)
HDL: 65 (ref 35–70)
LDL Cholesterol: 93
Triglycerides: 53 (ref 40–160)

## 2023-10-27 LAB — CBC AND DIFFERENTIAL
HCT: 40 (ref 36–46)
Hemoglobin: 12.7 (ref 12.0–16.0)
Neutrophils Absolute: 2.7
Platelets: 219 K/uL (ref 150–400)
WBC: 4.8

## 2023-10-27 LAB — COMPREHENSIVE METABOLIC PANEL WITH GFR
Albumin: 4.5 (ref 3.5–5.0)
Calcium: 9.1 (ref 8.7–10.7)
Globulin: 1.6
TSH: 2.57
eGFR: 108

## 2023-10-27 LAB — HEPATIC FUNCTION PANEL
ALT: 9 U/L (ref 7–35)
AST: 17 (ref 13–35)
Alkaline Phosphatase: 57 (ref 25–125)

## 2023-10-27 LAB — BASIC METABOLIC PANEL WITH GFR
BUN: 27 — AB (ref 4–21)
CO2: 24 — AB (ref 13–22)
Chloride: 103 (ref 99–108)
Creatinine: 0.7 (ref 0.5–1.1)
Glucose: 83
Potassium: 4.2 meq/L (ref 3.5–5.1)
Sodium: 142 (ref 137–147)

## 2023-10-27 LAB — TSH: TSH: 2.57 (ref 0.41–5.90)

## 2024-01-05 DIAGNOSIS — Z01419 Encounter for gynecological examination (general) (routine) without abnormal findings: Secondary | ICD-10-CM | POA: Diagnosis not present

## 2024-01-05 DIAGNOSIS — Z124 Encounter for screening for malignant neoplasm of cervix: Secondary | ICD-10-CM | POA: Diagnosis not present

## 2024-01-05 DIAGNOSIS — Z681 Body mass index (BMI) 19 or less, adult: Secondary | ICD-10-CM | POA: Diagnosis not present

## 2024-01-05 DIAGNOSIS — N951 Menopausal and female climacteric states: Secondary | ICD-10-CM | POA: Diagnosis not present

## 2024-01-12 LAB — HM PAP SMEAR

## 2024-02-18 ENCOUNTER — Ambulatory Visit (INDEPENDENT_AMBULATORY_CARE_PROVIDER_SITE_OTHER): Admitting: Family Medicine

## 2024-02-18 ENCOUNTER — Encounter: Payer: Self-pay | Admitting: Family Medicine

## 2024-02-18 VITALS — BP 112/68 | HR 60 | Temp 98.2°F | Ht 68.0 in | Wt 119.8 lb

## 2024-02-18 DIAGNOSIS — Z0000 Encounter for general adult medical examination without abnormal findings: Secondary | ICD-10-CM

## 2024-02-18 DIAGNOSIS — R87618 Other abnormal cytological findings on specimens from cervix uteri: Secondary | ICD-10-CM | POA: Diagnosis not present

## 2024-02-18 NOTE — Progress Notes (Signed)
 Phone [phone]   Subjective:  Patient presents today for their annual physical. Chief complaint-noted.   See problem oriented charting- ROS- full  review of systems was completed and negative Per full ROS sheet completed by patient except for topics noted under acute/chronic concerns  The following were reviewed and entered/updated in epic: Past Medical History:  Diagnosis Date   Anxiety    Related to flying   Patient Active Problem List   Diagnosis Date Noted   Osteitis pubis (HCC) 10/09/2016    Priority: Low   Vegan diet 09/12/2014    Priority: Low   Coccydynia 09/12/2014    Priority: Low   Health care maintenance 09/12/2014    Priority: Low   Positive TB test 07/07/2005    Priority: Low   Iron deficiency 04/30/2022   Calcific tendinitis of right shoulder 08/25/2017   Palpitations 09/12/2014   Past Surgical History:  Procedure Laterality Date   ADENOIDECTOMY     as child   FRACTURE SURGERY  1983   R humeral fracture as a child   OTHER SURGICAL HISTORY     fractured hunmerus 1983.   WISDOM TOOTH EXTRACTION  07/06/2010    Family History  Problem Relation Age of Onset   Arthritis Mother        knee   Obesity Mother    Hyperlipidemia Mother    Hyperlipidemia Father    Hypertension Father    COPD Father    Obesity Father    Stroke Father    Cancer Father        clear cell carcinoma   CAD Father        50, likely-has refused cath. high CAC   Lung cancer Maternal Grandfather        smoker    Medications- reviewed and updated Current Outpatient Medications  Medication Sig Dispense Refill   Cyanocobalamin (VITAMIN B-12 PO) Take by mouth.     estradiol (VIVELLE-DOT) 0.05 MG/24HR patch Per Dr. Celesta- apply 1 patch twice a week     magnesium gluconate (MAGONATE) 500 MG tablet Take 1 tablet (500 mg total) by mouth daily.     progesterone (PROMETRIUM) 100 MG capsule Per Dr. Celesta- takes daily     Turmeric 400 MG CAPS Take 400 mg by mouth daily.      busPIRone (BUSPAR) 5 MG tablet Take 1 tablet (5 mg total) by mouth 3 (three) times daily as needed. (Patient not taking: Reported on 02/18/2024) 90 tablet 1   No current facility-administered medications for this visit.    Allergies-reviewed and updated No Known Allergies  Social History   Social History Narrative   Married. Inocente '06[facility]. Husband is a hospitalist (may establish at [place])   Lives with husband and daughter.       Works at [employer]. 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 [facility] [place], [place].    Objective  Objective:  BP 112/68 (BP Location: Left Arm, Patient Position: Sitting, Cuff Size: Normal)   Pulse 60   Temp 98.2 F (36.8 C) (Temporal)   Ht 5' 8 (1.727 m)   Wt 119 lb 12.8 oz (54.3 kg)   SpO2 100%   BMI 18.22 kg/m  Gen: NAD, resting comfortably HEENT: Mucous membranes are moist. Oropharynx normal Neck: no thyromegaly CV: RRR no murmurs rubs or gallops Lungs: CTAB no crackles, wheeze, rhonchi Abdomen: soft/nontender/nondistended/normal bowel sounds. No rebound or guarding.  Ext: no edema Skin: warm, dry  Neuro: grossly normal, moves all extremities, PERRLA   Assessment and Plan   48 y.o. female presenting for annual physical.  Health Maintenance counseling: 1. Anticipatory guidance: Patient counseled regarding regular dental exams -q4 months, eye exams -sees Dr. Charmayne but establishing with new doctor,  avoiding smoking and second hand smoke , limiting alcohol to 1 beverage per day- rare social alcohol , no illicit drugs .   2. Risk factor reduction:  Advised patient of need for regular exercise and diet rich and fruits and vegetables to reduce risk of heart attack and stroke.  Exercise- maintaining treadmill 30 minutes 4 days a week plus weight training.  Diet/weight management-weight stable- mild weight gain would be favorable.  Wt Readings from Last 3 Encounters:  02/18/24  119 lb 12.8 oz (54.3 kg)  06/07/23 120 lb 12.8 oz (54.8 kg)  04/30/22 120 lb 3.2 oz (54.5 kg)  3. Immunizations/screenings/ancillary studies - fall flu shot with work.  She's holding off on COVID.  Immunization History  Administered Date(s) Administered   Hepatitis B 07/15/1999, 08/25/1999, 03/01/2000   Influenza,inj,Quad PF,6+ Mos 04/23/2021, 04/29/2022, 04/20/2023   Influenza-Unspecified 04/19/2014, 04/18/2019, 04/22/2020   MMR 01/05/1985, 04/05/2000   PFIZER(Purple Top)SARS-COV-2 Vaccination 07/04/2019, 07/25/2019, 04/06/2020, 01/05/2021   Td 07/07/2005   Tdap 04/25/2019   Varicella 04/05/2000  4. Cervical cancer screening- Pap smear 01-05-24 was normal other than endometrial cells noted.  HPV was negative.  She has had an ultrasound recently completed that did have a polyp . In past ascus. She will have hysteroscopy and D+C already scheduled to remove polyp.  5. Breast cancer screening-  breast exam with GYN and mammogram  03/10/23 and plans on 3d yearly 6. Colon cancer screening -  09/09/20 with [facility] with 10-year repeat planned 7. Skin cancer screening-has seen Dr. Junior- will return. advised regular sunscreen use. Denies worrisome, changing, or new skin lesions.  8. Birth control/STD check- uses condoms.  Monogamous. 9. Osteoporosis screening at 64- holding off as on hormone replacement therapy-still takes vitamin D and tries to get calcium in her diet 10. Smoking associated screening -never smoker  Status of chronic or acute concerns   #MMR- thinks may have been in [place]- maybe test next spring- she will message me next spring- would be quest- we could mail to her office   # Hormone replacement therapy through Dr. Zaida very helpful-helps sleep, less anxiety.  Still has introvert tendencies though  # Insulin resistance-A1c has been in healthy range- doctors day labs a1c of 5.4 Lab Results  Component Value Date   HGBA1C 5.3 08/28/2022   #  B12-continues take B12 every other day on vegan diet - added yogurt to get more protein and occasional fish   # Anxiety with travel-buspirone if needed- not traveling much   # Screening hyperlipidemia-ASCVD risk not substantially elevated Lab Results  Component Value Date   CHOL 169 10/27/2023   HDL 65 10/27/2023   LDLCALC 93 10/27/2023   TRIG 53 10/27/2023   CHOLHDL 2.8 Ratio 11/19/2008    Recommended follow up: Return in about 1 year (around 02/17/2025) for physical or sooner if needed.Schedule b4 you leave.  Lab/Order associations:   ICD-10-CM   1. Preventative health care  Z00.00       No orders of the defined types were placed in this encounter.   Return precautions advised.  Garnette Lukes, MD

## 2024-02-18 NOTE — Patient Instructions (Addendum)
 Reach out in spring and we can set up labs instead of doctors day labs  Mineral oil for ear full of wax OR can buy debrox OTC (available over the counter without a prescription)  Purchase mineral oil from laxative aisle Lay down on your side with ear that is bothering you facing up Use 3-4 drops with a dropper and place in ear for 30 seconds Place cotton swab outside of ear Turn to other side and allow this to drain Repeat 3-4 x a day Return to see us  if not improving within a few days   Recommended follow up: Return in about 1 year (around 02/17/2025) for physical or sooner if needed.Schedule b4 you leave.

## 2024-03-15 DIAGNOSIS — Z1231 Encounter for screening mammogram for malignant neoplasm of breast: Secondary | ICD-10-CM | POA: Diagnosis not present

## 2024-03-15 LAB — HM MAMMOGRAPHY

## 2024-03-16 ENCOUNTER — Encounter: Payer: Self-pay | Admitting: Family Medicine

## 2024-03-17 ENCOUNTER — Encounter: Payer: Self-pay | Admitting: Family Medicine

## 2024-03-17 NOTE — Telephone Encounter (Signed)
 Printed and sent to scan

## 2024-04-11 DIAGNOSIS — N840 Polyp of corpus uteri: Secondary | ICD-10-CM | POA: Diagnosis not present

## 2024-04-14 ENCOUNTER — Encounter: Payer: Self-pay | Admitting: Family Medicine

## 2024-06-12 ENCOUNTER — Encounter: Payer: 59 | Admitting: Family Medicine

## 2025-03-16 ENCOUNTER — Encounter: Admitting: Family Medicine
